# Patient Record
Sex: Male | Born: 1955 | Race: White | Hispanic: No | Marital: Single
Health system: Southern US, Community
[De-identification: ages and names within clinical notes are randomized; demographics above are authoritative.]

## PROBLEM LIST (undated history)

## (undated) DIAGNOSIS — D649 Anemia, unspecified: Secondary | ICD-10-CM

## (undated) DIAGNOSIS — G610 Guillain-Barre syndrome: Secondary | ICD-10-CM

## (undated) DIAGNOSIS — E119 Type 2 diabetes mellitus without complications: Secondary | ICD-10-CM

## (undated) DIAGNOSIS — I10 Essential (primary) hypertension: Secondary | ICD-10-CM

## (undated) DIAGNOSIS — I2510 Atherosclerotic heart disease of native coronary artery without angina pectoris: Secondary | ICD-10-CM

## (undated) DIAGNOSIS — R1310 Dysphagia, unspecified: Secondary | ICD-10-CM

## (undated) HISTORY — PX: TRACHEOSTOMY: SUR1362

---

## 2011-02-20 DEATH — deceased

## 2015-06-07 ENCOUNTER — Emergency Department (HOSPITAL_COMMUNITY): Payer: Medicare Other

## 2015-06-07 ENCOUNTER — Encounter (HOSPITAL_COMMUNITY): Payer: Self-pay | Admitting: Emergency Medicine

## 2015-06-07 ENCOUNTER — Inpatient Hospital Stay (HOSPITAL_COMMUNITY): Payer: Medicare Other

## 2015-06-07 ENCOUNTER — Inpatient Hospital Stay (HOSPITAL_COMMUNITY)
Admission: EM | Admit: 2015-06-07 | Discharge: 2015-06-10 | DRG: 004 | Disposition: A | Discharge status: Other Acute Inpatient Hospital | Payer: Medicare Other | Attending: Pulmonary Disease | Admitting: Pulmonary Disease

## 2015-06-07 DIAGNOSIS — E876 Hypokalemia: Secondary | ICD-10-CM | POA: Diagnosis present

## 2015-06-07 DIAGNOSIS — J189 Pneumonia, unspecified organism: Secondary | ICD-10-CM | POA: Diagnosis present

## 2015-06-07 DIAGNOSIS — J9621 Acute and chronic respiratory failure with hypoxia: Secondary | ICD-10-CM | POA: Diagnosis present

## 2015-06-07 DIAGNOSIS — R402312 Coma scale, best motor response, none, at arrival to emergency department: Secondary | ICD-10-CM | POA: Diagnosis present

## 2015-06-07 DIAGNOSIS — J9601 Acute respiratory failure with hypoxia: Secondary | ICD-10-CM

## 2015-06-07 DIAGNOSIS — J9819 Other pulmonary collapse: Secondary | ICD-10-CM

## 2015-06-07 DIAGNOSIS — R1310 Dysphagia, unspecified: Secondary | ICD-10-CM | POA: Diagnosis present

## 2015-06-07 DIAGNOSIS — D649 Anemia, unspecified: Secondary | ICD-10-CM | POA: Diagnosis not present

## 2015-06-07 DIAGNOSIS — I10 Essential (primary) hypertension: Secondary | ICD-10-CM | POA: Diagnosis not present

## 2015-06-07 DIAGNOSIS — Y95 Nosocomial condition: Secondary | ICD-10-CM | POA: Diagnosis present

## 2015-06-07 DIAGNOSIS — E119 Type 2 diabetes mellitus without complications: Secondary | ICD-10-CM | POA: Diagnosis present

## 2015-06-07 DIAGNOSIS — J9620 Acute and chronic respiratory failure, unspecified whether with hypoxia or hypercapnia: Secondary | ICD-10-CM | POA: Diagnosis not present

## 2015-06-07 DIAGNOSIS — R402212 Coma scale, best verbal response, none, at arrival to emergency department: Secondary | ICD-10-CM | POA: Diagnosis present

## 2015-06-07 DIAGNOSIS — R4182 Altered mental status, unspecified: Secondary | ICD-10-CM | POA: Diagnosis present

## 2015-06-07 DIAGNOSIS — J9600 Acute respiratory failure, unspecified whether with hypoxia or hypercapnia: Secondary | ICD-10-CM | POA: Diagnosis not present

## 2015-06-07 DIAGNOSIS — R000 Tachycardia, unspecified: Secondary | ICD-10-CM | POA: Diagnosis not present

## 2015-06-07 DIAGNOSIS — Z4659 Encounter for fitting and adjustment of other gastrointestinal appliance and device: Secondary | ICD-10-CM | POA: Diagnosis present

## 2015-06-07 DIAGNOSIS — Z91040 Latex allergy status: Secondary | ICD-10-CM | POA: Diagnosis not present

## 2015-06-07 DIAGNOSIS — T17990A Other foreign object in respiratory tract, part unspecified in causing asphyxiation, initial encounter: Secondary | ICD-10-CM | POA: Diagnosis present

## 2015-06-07 DIAGNOSIS — G9340 Encephalopathy, unspecified: Secondary | ICD-10-CM | POA: Diagnosis not present

## 2015-06-07 DIAGNOSIS — Z930 Tracheostomy status: Secondary | ICD-10-CM

## 2015-06-07 DIAGNOSIS — R7881 Bacteremia: Secondary | ICD-10-CM | POA: Diagnosis not present

## 2015-06-07 DIAGNOSIS — I2510 Atherosclerotic heart disease of native coronary artery without angina pectoris: Secondary | ICD-10-CM | POA: Diagnosis present

## 2015-06-07 DIAGNOSIS — R402112 Coma scale, eyes open, never, at arrival to emergency department: Secondary | ICD-10-CM | POA: Diagnosis not present

## 2015-06-07 DIAGNOSIS — G610 Guillain-Barre syndrome: Secondary | ICD-10-CM | POA: Diagnosis present

## 2015-06-07 DIAGNOSIS — J9811 Atelectasis: Secondary | ICD-10-CM | POA: Diagnosis not present

## 2015-06-07 DIAGNOSIS — E871 Hypo-osmolality and hyponatremia: Secondary | ICD-10-CM | POA: Diagnosis present

## 2015-06-07 DIAGNOSIS — Z91018 Allergy to other foods: Secondary | ICD-10-CM | POA: Diagnosis not present

## 2015-06-07 DIAGNOSIS — Z9911 Dependence on respirator [ventilator] status: Secondary | ICD-10-CM

## 2015-06-07 DIAGNOSIS — Z880 Allergy status to penicillin: Secondary | ICD-10-CM | POA: Diagnosis not present

## 2015-06-07 DIAGNOSIS — J9622 Acute and chronic respiratory failure with hypercapnia: Secondary | ICD-10-CM | POA: Diagnosis present

## 2015-06-07 DIAGNOSIS — J069 Acute upper respiratory infection, unspecified: Secondary | ICD-10-CM | POA: Insufficient documentation

## 2015-06-07 DIAGNOSIS — Z452 Encounter for adjustment and management of vascular access device: Secondary | ICD-10-CM

## 2015-06-07 DIAGNOSIS — L7682 Other postprocedural complications of skin and subcutaneous tissue: Secondary | ICD-10-CM | POA: Diagnosis not present

## 2015-06-07 DIAGNOSIS — J9690 Respiratory failure, unspecified, unspecified whether with hypoxia or hypercapnia: Secondary | ICD-10-CM

## 2015-06-07 DIAGNOSIS — R4189 Other symptoms and signs involving cognitive functions and awareness: Secondary | ICD-10-CM

## 2015-06-07 DIAGNOSIS — R401 Stupor: Secondary | ICD-10-CM | POA: Diagnosis not present

## 2015-06-07 HISTORY — DX: Essential (primary) hypertension: I10

## 2015-06-07 HISTORY — DX: Type 2 diabetes mellitus without complications: E11.9

## 2015-06-07 HISTORY — DX: Atherosclerotic heart disease of native coronary artery without angina pectoris: I25.10

## 2015-06-07 HISTORY — DX: Guillain-Barre syndrome: G61.0

## 2015-06-07 HISTORY — DX: Anemia, unspecified: D64.9

## 2015-06-07 HISTORY — DX: Dysphagia, unspecified: R13.10

## 2015-06-07 LAB — MRSA PCR SCREENING: MRSA BY PCR: NEGATIVE

## 2015-06-07 LAB — I-STAT ARTERIAL BLOOD GAS, ED
ACID-BASE EXCESS: 3 mmol/L — AB (ref 0.0–2.0)
Bicarbonate: 30.3 mEq/L — ABNORMAL HIGH (ref 20.0–24.0)
O2 SAT: 100 %
PCO2 ART: 54.4 mmHg — AB (ref 35.0–45.0)
PH ART: 7.354 (ref 7.350–7.450)
Patient temperature: 98.6
TCO2: 32 mmol/L (ref 0–100)
pO2, Arterial: 198 mmHg — ABNORMAL HIGH (ref 80.0–100.0)

## 2015-06-07 LAB — COMPREHENSIVE METABOLIC PANEL
ALT: 79 U/L — ABNORMAL HIGH (ref 17–63)
AST: 99 U/L — ABNORMAL HIGH (ref 15–41)
Albumin: 2.6 g/dL — ABNORMAL LOW (ref 3.5–5.0)
Alkaline Phosphatase: 235 U/L — ABNORMAL HIGH (ref 38–126)
Anion gap: 10 (ref 5–15)
BUN: 8 mg/dL (ref 6–20)
CHLORIDE: 91 mmol/L — AB (ref 101–111)
CO2: 29 mmol/L (ref 22–32)
Calcium: 9.6 mg/dL (ref 8.9–10.3)
Creatinine, Ser: 0.33 mg/dL — ABNORMAL LOW (ref 0.61–1.24)
Glucose, Bld: 269 mg/dL — ABNORMAL HIGH (ref 65–99)
POTASSIUM: 4.3 mmol/L (ref 3.5–5.1)
Sodium: 130 mmol/L — ABNORMAL LOW (ref 135–145)
TOTAL PROTEIN: 6.3 g/dL — AB (ref 6.5–8.1)
Total Bilirubin: 0.3 mg/dL (ref 0.3–1.2)

## 2015-06-07 LAB — TROPONIN I: Troponin I: 0.04 ng/mL — ABNORMAL HIGH (ref <0.031)

## 2015-06-07 LAB — I-STAT CG4 LACTIC ACID, ED: Lactic Acid, Venous: 2.1 mmol/L (ref 0.5–2.0)

## 2015-06-07 LAB — CBC WITH DIFFERENTIAL/PLATELET
BASOS PCT: 0 %
Basophils Absolute: 0 10*3/uL (ref 0.0–0.1)
EOS PCT: 1 %
Eosinophils Absolute: 0.3 10*3/uL (ref 0.0–0.7)
HCT: 39.9 % (ref 39.0–52.0)
Hemoglobin: 12.6 g/dL — ABNORMAL LOW (ref 13.0–17.0)
LYMPHS ABS: 3.3 10*3/uL (ref 0.7–4.0)
Lymphocytes Relative: 12 %
MCH: 27.3 pg (ref 26.0–34.0)
MCHC: 31.6 g/dL (ref 30.0–36.0)
MCV: 86.4 fL (ref 78.0–100.0)
MONO ABS: 1.6 10*3/uL — AB (ref 0.1–1.0)
MONOS PCT: 6 %
NEUTROS ABS: 22 10*3/uL — AB (ref 1.7–7.7)
Neutrophils Relative %: 81 %
PLATELETS: 438 10*3/uL — AB (ref 150–400)
RBC: 4.62 MIL/uL (ref 4.22–5.81)
RDW: 14.3 % (ref 11.5–15.5)
WBC: 27.2 10*3/uL — ABNORMAL HIGH (ref 4.0–10.5)

## 2015-06-07 LAB — GLUCOSE, CAPILLARY
Glucose-Capillary: 184 mg/dL — ABNORMAL HIGH (ref 65–99)
Glucose-Capillary: 165 mg/dL — ABNORMAL HIGH (ref 65–99)

## 2015-06-07 MED ORDER — ETOMIDATE 2 MG/ML IV SOLN
INTRAVENOUS | Status: DC | PRN
  Administered 2015-06-07: 20 mg via INTRAVENOUS

## 2015-06-07 MED ORDER — SODIUM CHLORIDE 0.9 % IV SOLN
INTRAVENOUS | Status: DC
  Administered 2015-06-07 – 2015-06-09 (×4): via INTRAVENOUS

## 2015-06-07 MED ORDER — PANTOPRAZOLE SODIUM 40 MG IV SOLR
40.0000 mg | Freq: Every day | INTRAVENOUS | Status: DC
  Administered 2015-06-07 – 2015-06-08 (×2): 40 mg via INTRAVENOUS
  Filled 2015-06-07 (×3): qty 40

## 2015-06-07 MED ORDER — VANCOMYCIN HCL IN DEXTROSE 1-5 GM/200ML-% IV SOLN
1000.0000 mg | Freq: Once | INTRAVENOUS | Status: AC
  Administered 2015-06-07: 1000 mg via INTRAVENOUS
  Filled 2015-06-07: qty 200

## 2015-06-07 MED ORDER — SENNOSIDES 8.8 MG/5ML PO SYRP: 5.0000 mL | ORAL_SOLUTION | Freq: Two times a day (BID) | ORAL | Status: DC | PRN

## 2015-06-07 MED ORDER — LEVOFLOXACIN IN D5W 750 MG/150ML IV SOLN
750.0000 mg | INTRAVENOUS | Status: DC
  Administered 2015-06-07 – 2015-06-09 (×3): 750 mg via INTRAVENOUS
  Filled 2015-06-07 (×4): qty 150

## 2015-06-07 MED ORDER — AZTREONAM 2 G IJ SOLR
2.0000 g | INTRAMUSCULAR | Status: AC
  Administered 2015-06-07: 2 g via INTRAVENOUS
  Filled 2015-06-07 (×2): qty 2

## 2015-06-07 MED ORDER — DEXTROSE 5 % IV SOLN
2.0000 g | Freq: Three times a day (TID) | INTRAVENOUS | Status: DC
  Administered 2015-06-07 – 2015-06-09 (×4): 2 g via INTRAVENOUS
  Filled 2015-06-07 (×6): qty 2

## 2015-06-07 MED ORDER — ANTISEPTIC ORAL RINSE SOLUTION (CORINZ)
7.0000 mL | Freq: Four times a day (QID) | OROMUCOSAL | Status: DC
  Administered 2015-06-07 – 2015-06-10 (×11): 7 mL via OROMUCOSAL

## 2015-06-07 MED ORDER — DEXTROSE 5 % IV SOLN
1.0000 g | Freq: Three times a day (TID) | INTRAVENOUS | Status: DC
  Filled 2015-06-07 (×2): qty 1

## 2015-06-07 MED ORDER — VITAL HIGH PROTEIN PO LIQD
1000.0000 mL | ORAL | Status: DC
  Administered 2015-06-07: 1000 mL
  Filled 2015-06-07 (×2): qty 1000

## 2015-06-07 MED ORDER — HEPARIN SODIUM (PORCINE) 5000 UNIT/ML IJ SOLN
5000.0000 [IU] | Freq: Three times a day (TID) | INTRAMUSCULAR | Status: DC
  Administered 2015-06-07 – 2015-06-10 (×8): 5000 [IU] via SUBCUTANEOUS
  Filled 2015-06-07 (×9): qty 1

## 2015-06-07 MED ORDER — INSULIN GLARGINE 100 UNIT/ML ~~LOC~~ SOLN
5.0000 [IU] | Freq: Every day | SUBCUTANEOUS | Status: DC
  Administered 2015-06-07 – 2015-06-09 (×3): 5 [IU] via SUBCUTANEOUS
  Filled 2015-06-07 (×4): qty 0.05

## 2015-06-07 MED ORDER — FENTANYL CITRATE (PF) 100 MCG/2ML IJ SOLN
25.0000 ug | INTRAMUSCULAR | Status: DC | PRN
Start: 2015-06-07 — End: 2015-06-10
  Administered 2015-06-07: 50 ug via INTRAVENOUS
  Administered 2015-06-07: 25 ug via INTRAVENOUS
  Filled 2015-06-07 (×2): qty 2

## 2015-06-07 MED ORDER — FENTANYL CITRATE (PF) 100 MCG/2ML IJ SOLN
100.0000 ug | INTRAMUSCULAR | Status: DC | PRN
Start: 2015-06-07 — End: 2015-06-10
  Administered 2015-06-08 – 2015-06-10 (×10): 100 ug via INTRAVENOUS
  Filled 2015-06-07 (×10): qty 2

## 2015-06-07 MED ORDER — INSULIN ASPART 100 UNIT/ML ~~LOC~~ SOLN
0.0000 [IU] | SUBCUTANEOUS | Status: DC
  Administered 2015-06-07 (×2): 3 [IU] via SUBCUTANEOUS
  Administered 2015-06-08: 5 [IU] via SUBCUTANEOUS
  Administered 2015-06-08: 3 [IU] via SUBCUTANEOUS
  Administered 2015-06-08: 2 [IU] via SUBCUTANEOUS
  Administered 2015-06-08 (×2): 5 [IU] via SUBCUTANEOUS
  Administered 2015-06-08: 3 [IU] via SUBCUTANEOUS
  Administered 2015-06-09: 5 [IU] via SUBCUTANEOUS
  Administered 2015-06-09: 3 [IU] via SUBCUTANEOUS
  Administered 2015-06-09: 2 [IU] via SUBCUTANEOUS
  Administered 2015-06-09 – 2015-06-10 (×6): 3 [IU] via SUBCUTANEOUS

## 2015-06-07 MED ORDER — SODIUM CHLORIDE 0.9 % IV BOLUS (SEPSIS)
500.0000 mL | Freq: Once | INTRAVENOUS | Status: AC
  Administered 2015-06-07: 500 mL via INTRAVENOUS

## 2015-06-07 MED ORDER — ALBUTEROL SULFATE (2.5 MG/3ML) 0.083% IN NEBU
2.5000 mg | INHALATION_SOLUTION | RESPIRATORY_TRACT | Status: DC | PRN
  Administered 2015-06-10: 2.5 mg via RESPIRATORY_TRACT
  Filled 2015-06-07: qty 3

## 2015-06-07 MED ORDER — DEXTROSE 5 % IV SOLN
1.0000 g | Freq: Once | INTRAVENOUS | Status: DC
  Filled 2015-06-07: qty 1

## 2015-06-07 MED ORDER — SODIUM CHLORIDE 0.9 % IV SOLN: 250.0000 mL | INTRAVENOUS | Status: DC | PRN

## 2015-06-07 MED ORDER — BISACODYL 10 MG RE SUPP
10.0000 mg | Freq: Every day | RECTAL | Status: DC | PRN
Start: 2015-06-07 — End: 2015-06-10

## 2015-06-07 MED ORDER — VANCOMYCIN HCL 10 G IV SOLR
1250.0000 mg | Freq: Two times a day (BID) | INTRAVENOUS | Status: DC
  Administered 2015-06-08 – 2015-06-09 (×3): 1250 mg via INTRAVENOUS
  Filled 2015-06-07 (×5): qty 1250

## 2015-06-07 MED ORDER — MIDAZOLAM HCL 2 MG/2ML IJ SOLN
2.0000 mg | INTRAMUSCULAR | Status: DC | PRN
  Administered 2015-06-07 – 2015-06-09 (×6): 2 mg via INTRAVENOUS
  Filled 2015-06-07 (×6): qty 2

## 2015-06-07 MED ORDER — CHLORHEXIDINE GLUCONATE 0.12% ORAL RINSE (MEDLINE KIT)
15.0000 mL | Freq: Two times a day (BID) | OROMUCOSAL | Status: DC
  Administered 2015-06-07 – 2015-06-10 (×6): 15 mL via OROMUCOSAL

## 2015-06-07 NOTE — ED Notes (Signed)
Patient now alert follow commands. Patient able to feel RN touching both sides of face, arms and feet. Patient able to stick out tongue. Patient nod yes and no to answer questions .

## 2015-06-07 NOTE — Progress Notes (Signed)
Patient intubated by ED physician good color change on ETCO2 detector, BBS equal,  X-Ray confirmed placement,  placed on above vent settings MD aware.

## 2015-06-07 NOTE — Code Documentation (Addendum)
Patient comes from [facility] hospital states normally talkative. Per EMS Patient had passed swallow screen with Honey and began eating lunch and became unresponsive. Patient Full Code per ems. Patient unable to follow commands on arrival. Patient given OPA  Vitals" HR 108 CBG 250. Patient has single lumen PICC Right arm placed PTA

## 2015-06-07 NOTE — Code Documentation (Signed)
Respirtory currently bagging.

## 2015-06-07 NOTE — Progress Notes (Addendum)
ANTIBIOTIC CONSULT NOTE - INITIAL  Pharmacy Consult for vancomycin + cefepime Indication: rule out pneumonia  Allergies  Allergen Reactions  . Dairy Aid [Lactase]   . Eggs Or Egg-Derived Products   . Gluten Meal   . Latex   . Penicillins     Patient Measurements: Height: 6\' 2"  (188 cm) IBW/kg (Calculated) : 82.2 Adjusted Body Weight:   Vital Signs: Temp: 99.3 F (37.4 C) (12/17 1407) Temp Source: Oral (12/17 1407) BP: 106/72 mmHg (12/17 1500) Pulse Rate: 102 (12/17 1500) Intake/Output from previous day:   Intake/Output from this shift:    Labs:  Recent Labs  06/07/15 1302  WBC 27.2*  HGB 12.6*  PLT 438*  CREATININE 0.33*   CrCl cannot be calculated (Unknown ideal weight.). No results for input(s): VANCOTROUGH, VANCOPEAK, VANCORANDOM, GENTTROUGH, GENTPEAK, GENTRANDOM, TOBRATROUGH, TOBRAPEAK, TOBRARND, AMIKACINPEAK, AMIKACINTROU, AMIKACIN in the last 72 hours.   Microbiology: No results found for this or any previous visit (from the past 720 hour(s)).  Medical History: Past Medical History  Diagnosis Date  . Guillain Barr syndrome (HCC)   . Anemia   . Hypertension   . Coronary artery disease   . Diabetes mellitus without complication (HCC)   . Dysphagia     Medications:  Anti-infectives    Start     Dose/Rate Route Frequency Ordered Stop   06/08/15 0400  vancomycin (VANCOCIN) 1,250 mg in sodium chloride 0.9 % 250 mL IVPB     1,250 mg 166.7 mL/hr over 90 Minutes Intravenous Every 12 hours 06/07/15 1514     06/08/15 0000  ceFEPIme (MAXIPIME) 1 g in dextrose 5 % 50 mL IVPB     1 g 100 mL/hr over 30 Minutes Intravenous Every 8 hours 06/07/15 1514     06/07/15 1530  vancomycin (VANCOCIN) IVPB 1000 mg/200 mL premix     1,000 mg 200 mL/hr over 60 Minutes Intravenous  Once 06/07/15 1510     06/07/15 1430  ceFEPIme (MAXIPIME) 1 g in dextrose 5 % 50 mL IVPB     1 g 100 mL/hr over 30 Minutes Intravenous  Once 06/07/15 1414     06/07/15 1400  vancomycin  (VANCOCIN) IVPB 1000 mg/200 mL premix     1,000 mg 200 mL/hr over 60 Minutes Intravenous  Once 06/07/15 1355       Assessment: 59 yom presented to the hospital unresponsive from [facility]. To start empiric vancomycin + cefepime for possible pneumonia. Pt with Tmax of 99.3 and WBC is elevated at 27.2. Scr is 0.33. Pt has a history of GBS so renal function is difficult to estimate. Lactic acid is elevated at 2.1.   Vanc 12/17>> Cefepime 12/17>>  Goal of Therapy:  Vancomycin trough level 15-20 mcg/ml  Plan:  - Vancomycin 2gm IV x 1 (1gm + 1gm doses) then 1250mg  IV Q12H - Cefepime 1gm IV Q8H - F/u renal fxn, C&S, [facility]  Rumbarger, Drake Leachachel Lynn 06/07/2015,3:15 PM  Addendum:  ED Rn called and stated that [facility] that pt may have hives/anaphylaxis to cephalexin. D/w Dr. Patria Maneampos, we'll change cefepime to aztreonam for GNR coverage.   Ulyses SouthwardMinh Pham, PharmD Pager: [phone] 06/07/2015 3:59 PM

## 2015-06-07 NOTE — H&P (Signed)
PULMONARY / CRITICAL CARE MEDICINE   Name: Curtis Burgess MRN: [id number] DOB: 12/22/1955    ADMISSION DATE:  06/07/2015  REFERRING MD:  Curtis Burgess([name])   CHIEF COMPLAINT:  Respiratory failure, AMS   HISTORY OF PRESENT ILLNESS:   59yo male with hx HTN, CAD, DM recent prolonged illness r/t guillain Barre with prolonged vent course and trach placement at [facility].  [facility], ultimately liberated from vent and was recently decannulated 2 days prior to admit.  On 12/17 he passed swallow eval and was eating lunch when he suddenly became unresponsive. EMS was called and pt remained unresponsive with GSC 3, intubated in ER.  He was taken to head CT and on returning from CT became suddenly responsive, appropriate, MAE.  Has ongoing generalized weakness r/t GB.  PCCM called to admit.   Pt c/o back and L leg pain.  Denies any memory of earlier events.  Denies SOB, chest pain, cough, hemoptysis, fevers, headache, lightheadedness, dysphagia today.    PAST MEDICAL HISTORY :  He  has a past medical history of Guillain Barr syndrome (HCC); Anemia; Hypertension; Coronary artery disease; Diabetes mellitus without complication (HCC); and Dysphagia.  PAST SURGICAL HISTORY: He  has no past surgical history on file.  Allergies  Allergen Reactions  . Dairy Aid [Lactase]   . Eggs Or Egg-Derived Products   . Gluten Meal   . Latex   . Penicillins     No current facility-administered medications on file prior to encounter.   No current outpatient prescriptions on file prior to encounter.    FAMILY HISTORY:  No known family hz seizures   SOCIAL HISTORY: He     REVIEW OF SYSTEMS:   As per HPI - All other systems reviewed and were neg.    SUBJECTIVE:    VITAL SIGNS: BP 104/70 mmHg  Pulse 101  Temp(Src) 99.3 F (37.4 C) (Oral)  Resp 27  Ht 6\' 2"  (1.88 m)  SpO2 100%  HEMODYNAMICS:    VENTILATOR SETTINGS: Vent Mode:  [-] PRVC FiO2 (%):  [100 %] 100 % Set Rate:  [18 bmp] 18  bmp Vt Set:  [650 mL] 650 mL PEEP:  [5 cmH20] 5 cmH20 Plateau Pressure:  [22 cmH20] 22 cmH20  INTAKE / OUTPUT:    PHYSICAL EXAMINATION: General:  Chronically ill appearing male, NAD on vent  Neuro:  Now awake, alert, nods appropriately, MAE, significant gen weakness  HEENT:  Mm moist, ETT, trach stoma essentially healed  Cardiovascular:  s1s2 rrr Lungs:  resps even non labored on vent, few scattered rhonchi  Abdomen:  Round, soft, non tender  Musculoskeletal:  Warm and dry, scant BLE edema  LABS:  BMET  Recent Labs Lab 06/07/15 1302  NA 130*  K 4.3  CL 91*  CO2 29  BUN 8  CREATININE 0.33*  GLUCOSE 269*    Electrolytes  Recent Labs Lab 06/07/15 1302  CALCIUM 9.6    CBC  Recent Labs Lab 06/07/15 1302  WBC 27.2*  HGB 12.6*  HCT 39.9  PLT 438*    Coag's No results for input(s): APTT, INR in the last 168 hours.  Sepsis Markers  Recent Labs Lab 06/07/15 1330  LATICACIDVEN 2.10*    ABG  Recent Labs Lab 06/07/15 1400  PHART 7.354  PCO2ART 54.4*  PO2ART 198.0*    Liver Enzymes  Recent Labs Lab 06/07/15 1302  AST 99*  ALT 79*  ALKPHOS 235*  BILITOT 0.3  ALBUMIN 2.6*    Cardiac  Enzymes  Recent Labs Lab 06/07/15 1302  TROPONINI 0.04*    Glucose No results for input(s): GLUCAP in the last 168 hours.  Imaging Ct Head Wo Contrast  06/07/2015  CLINICAL DATA:  Unresponsive patient. EXAM: CT HEAD WITHOUT CONTRAST TECHNIQUE: Contiguous axial images were obtained from the base of the skull through the vertex without intravenous contrast. COMPARISON:  None. FINDINGS: Ventricles and sulci are prominent compatible with atrophy. Periventricular and subcortical white matter hypodensity compatible with chronic small vessel ischemic changes. No evidence for acute cortically based infarct, intracranial hemorrhage, mass lesion or mass-effect. Orbits are unremarkable. Paranasal sinuses are well aerated. Mastoid air cells are unremarkable. Calvarium  is intact. IMPRESSION: No acute intracranial process. Chronic small vessel ischemic changes. Electronically Signed   By: Annia Belt M.D.   On: 06/07/2015 14:03   Dg Chest Portable 1 View  06/07/2015  CLINICAL DATA:  Patient is unresponsive. Evaluate endotracheal tube placement. EXAM: PORTABLE CHEST 1 VIEW COMPARISON:  None. FINDINGS: ET tube terminates in the distal trachea, 1 cm superior to the carina. Right upper extremity PICC line tip projects over the superior vena cava. Stable cardiac and mediastinal contours. Left mid and lower lung heterogeneous pulmonary opacities. Heterogeneous opacities right lung base. Probable small left pleural effusion. IMPRESSION: Left mid and lower lung and right lung base heterogeneous opacities concerning for multi focal infection or aspiration in the appropriate clinical setting. ET tube terminates in the distal trachea.  Recommend retraction. Continued radiographic follow-up is recommended to ensure resolution of pulmonary findings. Electronically Signed   By: Annia Belt M.D.   On: 06/07/2015 13:59     STUDIES:  CT head 12/17>>> neg acute   CULTURES: Sputum 12/17>>>  ANTIBIOTICS: vanc 12/17>>>  Cefepime 12/17>>>  SIGNIFICANT EVENTS:   LINES/TUBES: ETT 12/17>>> RUE PICC (pta) >>>  DISCUSSION: 59yo male with previous trach in setting guillain barre, recently decannulated presenting with AMS of unclear etiology.  Suspect primary respiratory failure with hypercarbia, which resolved with mechanical ventilation.    ASSESSMENT / PLAN:  PULMONARY Acute on chronic respiratory failure  ?HCAP  P:   Vent support - 8cc/kg - did not tol PS r/t poor Vt F/u CXR  F/u ABG  Ongoing neuromuscular weakness - may need redo trach for now.   abx as below    CARDIOVASCULAR Hx HTN  Hx CAD  P:  Hold antiHTN for now  ASA   RENAL Hyponatremia  P:   F/u chem   GASTROINTESTINAL Dysphagia  P:   TF  Will need repeat swallow eval if extubated  PEG may  still be necessary   HEMATOLOGIC Leukocytosis  P:  F/u cbc  SQ heparin   INFECTIOUS ?HCAP  P:   Vanc, cefepime as above  Sputum culture   ENDOCRINE DM    P:   SSI, low dose lantus  Await pharmacy med rec   NEUROLOGIC AMS - acute.  Now resolved.  Unclear etiology.  Suspect acute respiratory failure r/t HCAP +/- residual neuromuscular weakness from guillain barre.  Doubt seizure, no known seizure hx.  Head CT neg.   Hx guillain barre  P:   RASS goal: 0 PRN fentanyl while on vent     FAMILY  - Updates:  Pt updated, no family available 12/17    Dirk Dress, NP 06/07/2015  2:59 PM Pager: [phone] or [phone]

## 2015-06-07 NOTE — ED Provider Notes (Signed)
CSN: [id number]     Arrival date & time 06/07/15  1255 History   First MD Initiated Contact with Patient 06/07/15 1300     Chief Complaint  Patient presents with  . unresponsive      Level V caveat: Unresponsive  HPI Patient is brought to the emergency department from [facility] after an episode of unresponsiveness while eating lunch.  She is currently rehabilitating from a prolonged course of Katheran AweGilliam Barr and ventilatory dependent respiratory failure for which she required tracheostomy.  He never had a PEG tube placed.  He reported he was decannulated 2 days ago and had been in his normal state of health for him this morning.  His reported that he had a swallowing study performed for which he passed and was given lunch at which point he developed unresponsiveness.  EMS reports the patient's been unresponsive in route and required nonrebreather for hypoxia.  Patient is brought to the emergency department as his GCS 3.  He is not moving anything.  He will not respond to painful stimuli.  He will not localize or withdrawal.. Intubated on arrival for airway protection.    Past Medical History  Diagnosis Date  . Guillain Barr syndrome (HCC)   . Anemia   . Hypertension   . Coronary artery disease   . Diabetes mellitus without complication (HCC)   . Dysphagia    History reviewed. No pertinent past surgical history. No family history on file. Social History  Substance Use Topics  . Smoking status: Unknown If Ever Smoked  . Smokeless tobacco: None  . Alcohol Use: None    Review of Systems  Unable to perform ROS: Mental status change      Allergies  Dairy aid; Eggs or egg-derived products; Gluten meal; Latex; and Penicillins  Home Medications   Prior to Admission medications   Not on File   BP 120/74 mmHg  Pulse 103  Temp(Src) 99.3 F (37.4 C) (Oral)  Resp 27  Ht 6\' 2"  (1.88 m)  SpO2 100% Physical Exam  Constitutional: He appears well-developed.   Chronically ill-appearing  HENT:  Head: Normocephalic and atraumatic.  Eyes: EOM are normal.  Neck: Neck supple.  Tracheostomy stoma is closing at this time  Cardiovascular: Normal rate, regular rhythm, normal heart sounds and intact distal pulses.   Pulmonary/Chest: Effort normal and breath sounds normal. No respiratory distress.  Abdominal: Soft. He exhibits no distension. There is no tenderness.  Musculoskeletal: He exhibits edema.  Neurological:  GCS 3  Skin: Skin is warm and dry. No rash noted.  Psychiatric: He has a normal mood and affect.  Nursing note and vitals reviewed.   ED Course  Procedures (including critical care time)  INTUBATION Performed by: Lyanne CoAMPOS,Cidney Kirkwood M Required items: required blood products, implants, devices, and special equipment available Patient identity confirmed: provided demographic data and hospital-assigned identification number Time out: Immediately prior to procedure a "time out" was called to verify the correct patient, procedure, equipment, support staff and site/side marked as required. Indications: uresponsive/airway protection Intubation method: Glidescope Laryngoscopy  Preoxygenation: BVM Sedatives: Etomidate Paralytic: NONE Tube Size: 7.5 cuffed Post-procedure assessment: chest rise and ETCO2 monitor Breath sounds: equal and absent over the epigastrium Tube secured with: ETT holder Chest x-ray interpreted by radiologist and me. Chest x-ray findings: endotracheal tube slightly deep, will be pulled back Patient tolerated the procedure well with no immediate complications.   CRITICAL CARE Performed by: Lyanne CoAMPOS,Xsavier Seeley M Total critical care time: 35 minutes Critical care time was  exclusive of separately billable procedures and treating other patients. Critical care was necessary to treat or prevent imminent or life-threatening deterioration. Critical care was time spent personally by me on the following activities: development of  treatment plan with patient and/or surrogate as well as nursing, discussions with consultants, evaluation of patient's response to treatment, examination of patient, obtaining history from patient or surrogate, ordering and performing treatments and interventions, ordering and review of laboratory studies, ordering and review of radiographic studies, pulse oximetry and re-evaluation of patient's condition.   Labs Review Labs Reviewed  CBC WITH DIFFERENTIAL/PLATELET - Abnormal; Notable for the following:    WBC 27.2 (*)    Hemoglobin 12.6 (*)    Platelets 438 (*)    All other components within normal limits  I-STAT CG4 LACTIC ACID, ED - Abnormal; Notable for the following:    Lactic Acid, Venous 2.10 (*)    All other components within normal limits  I-STAT ARTERIAL BLOOD GAS, ED - Abnormal; Notable for the following:    pCO2 arterial 54.4 (*)    pO2, Arterial 198.0 (*)    Bicarbonate 30.3 (*)    Acid-Base Excess 3.0 (*)    All other components within normal limits  CULTURE, BLOOD (ROUTINE X 2)  CULTURE, BLOOD (ROUTINE X 2)  COMPREHENSIVE METABOLIC PANEL  TROPONIN I    Imaging Review Ct Head Wo Contrast  06/07/2015  CLINICAL DATA:  Unresponsive patient. EXAM: CT HEAD WITHOUT CONTRAST TECHNIQUE: Contiguous axial images were obtained from the base of the skull through the vertex without intravenous contrast. COMPARISON:  None. FINDINGS: Ventricles and sulci are prominent compatible with atrophy. Periventricular and subcortical white matter hypodensity compatible with chronic small vessel ischemic changes. No evidence for acute cortically based infarct, intracranial hemorrhage, mass lesion or mass-effect. Orbits are unremarkable. Paranasal sinuses are well aerated. Mastoid air cells are unremarkable. Calvarium is intact. IMPRESSION: No acute intracranial process. Chronic small vessel ischemic changes. Electronically Signed   By: Annia Belt M.D.   On: 06/07/2015 14:03   Dg Chest Portable 1  View  06/07/2015  CLINICAL DATA:  Patient is unresponsive. Evaluate endotracheal tube placement. EXAM: PORTABLE CHEST 1 VIEW COMPARISON:  None. FINDINGS: ET tube terminates in the distal trachea, 1 cm superior to the carina. Right upper extremity PICC line tip projects over the superior vena cava. Stable cardiac and mediastinal contours. Left mid and lower lung heterogeneous pulmonary opacities. Heterogeneous opacities right lung base. Probable small left pleural effusion. IMPRESSION: Left mid and lower lung and right lung base heterogeneous opacities concerning for multi focal infection or aspiration in the appropriate clinical setting. ET tube terminates in the distal trachea.  Recommend retraction. Continued radiographic follow-up is recommended to ensure resolution of pulmonary findings. Electronically Signed   By: Annia Belt M.D.   On: 06/07/2015 13:59   I have personally reviewed and evaluated these images and lab results as part of my medical decision-making.   EKG Interpretation   Date/Time:  Saturday June 07 2015 12:59:13 EST Ventricular Rate:  108 PR Interval:  153 QRS Duration: 103 QT Interval:  339 QTC Calculation: 454 R Axis:   -15 Text Interpretation:  Sinus tachycardia Right atrial enlargement  Borderline left axis deviation RSR' in V1 or V2, probably normal variant  Borderline ST depression, anterolateral leads Baseline wander in lead(s)  V4 V5 V6 No old tracing to compare Confirmed by Calianne Larue  MD, Caryn Bee ([id number])  on 06/07/2015 1:39:35 PM      MDM   Final  diagnoses:  Acute respiratory failure, unspecified whether with hypoxia or hypercapnia (HCC)  Unresponsive    Acute altered mental status.  While in the emergency department after head CT and chest x-ray his mental status has significantly improved.  This may represent hypercarbic respiratory failure.  Seizure with postictal status possibility as well.  Patient is currently following commands.  He is not pulling  tidal volumes that are significant enough to extubate at this time.  Critical care treatment.  Chest x-ray concerning for aspiration versus developing pneumonia.  Facial be covered for healthcare associated pneumonia.    Azalia Bilis, MD 06/07/15 [phone]

## 2015-06-07 NOTE — ED Notes (Signed)
Lactic Acid results shown to Dr.[name] ED-Lab

## 2015-06-07 NOTE — ED Notes (Signed)
Mucus noted in airway Per MD.

## 2015-06-07 NOTE — ED Notes (Signed)
Prepare for intabutation

## 2015-06-07 NOTE — Progress Notes (Signed)
eLink Physician-Brief Progress Note Patient Name: Curtis Burgess DOB: 11/12/1955 MRN: [id number]   Date of Service  06/07/2015  HPI/Events of Note  Sinus Tachycardia - HR = 132. Not febrile at present.   eICU Interventions  Will order: 1. Bolus with 0.9 NaCl 500 mL IV over 30 minutes now.      Intervention Category Intermediate Interventions: Arrhythmia - evaluation and management  Kaida Games Eugene 06/07/2015, 7:03 PM

## 2015-06-07 NOTE — Progress Notes (Signed)
ANTIBIOTIC CONSULT NOTE - INITIAL  Pharmacy Consult for cefepime Indication: pneumonia  Allergies  Allergen Reactions  . Dairy Aid [Lactase]   . Eggs Or Egg-Derived Products   . Gluten Meal   . Latex   . Penicillins     Patient Measurements: Height: 6\' 2"  (188 cm) IBW/kg (Calculated) : 82.2   Vital Signs: BP: 120/74 mmHg (12/17 1345) Pulse Rate: 103 (12/17 1345) Intake/Output from previous day:   Intake/Output from this shift:    Labs:  Recent Labs  06/07/15 1302  WBC 27.2*  HGB 12.6*  PLT 438*   CrCl cannot be calculated (Unknown ideal weight.). No results for input(s): VANCOTROUGH, VANCOPEAK, VANCORANDOM, GENTTROUGH, GENTPEAK, GENTRANDOM, TOBRATROUGH, TOBRAPEAK, TOBRARND, AMIKACINPEAK, AMIKACINTROU, AMIKACIN in the last 72 hours.   Microbiology: No results found for this or any previous visit (from the past 720 hour(s)).  Medical History: Past Medical History  Diagnosis Date  . Guillain Barr syndrome (HCC)   . Anemia   . Hypertension   . Coronary artery disease   . Diabetes mellitus without complication (HCC)   . Dysphagia     Assessment: [age] YOM from [facility] who was eating lunch and became unresponsive. Required intubation in ED.  WBC elevated at 27.2, no temperature measured yet. Patient tachypnic and tachycardic in the ED. Labs are in process here.  Noted in Care Everywhere that was diagnosed with [facility] this year. Last SCr on file from there is 0.2- likely loss of muscle mass and not a true representation of renal function.  Vancomycin 1g IV x1 ordered by EDP. No weight on file for patient.  12/17 BCx: sent  Goal of Therapy:  Vancomycin trough level 15-20 mcg/ml  Plan:  -cefepime 1g IV x1 for now -follow up lab work, documentation of weight to determine maintenance doses -follow for orders to continue vancomycin  Hussein Macdougal D. Piotr Christopher, PharmD, BCPS Clinical Pharmacist Pager: [phone] 06/07/2015 2:10  PM

## 2015-06-07 NOTE — ED Notes (Signed)
Attempted report 

## 2015-06-07 NOTE — Code Documentation (Signed)
7 1/2 ET tube  26 at the lip. Color changed noted.

## 2015-06-07 NOTE — ED Notes (Signed)
Attempted to contact Son. Son did not answer patient request to try within a hour.

## 2015-06-07 NOTE — Code Documentation (Signed)
Patient unable to follow MD commands.

## 2015-06-08 ENCOUNTER — Inpatient Hospital Stay (HOSPITAL_COMMUNITY): Payer: Medicare Other

## 2015-06-08 DIAGNOSIS — J9620 Acute and chronic respiratory failure, unspecified whether with hypoxia or hypercapnia: Secondary | ICD-10-CM

## 2015-06-08 LAB — POCT I-STAT 3, ART BLOOD GAS (G3+)
Acid-Base Excess: 1 mmol/L (ref 0.0–2.0)
Bicarbonate: 24.9 mEq/L — ABNORMAL HIGH (ref 20.0–24.0)
O2 SAT: 97 %
TCO2: 26 mmol/L (ref 0–100)
pCO2 arterial: 34.7 mmHg — ABNORMAL LOW (ref 35.0–45.0)
pH, Arterial: 7.463 — ABNORMAL HIGH (ref 7.350–7.450)
pO2, Arterial: 89 mmHg (ref 80.0–100.0)

## 2015-06-08 LAB — GLUCOSE, CAPILLARY
GLUCOSE-CAPILLARY: 204 mg/dL — AB (ref 65–99)
GLUCOSE-CAPILLARY: 179 mg/dL — AB (ref 65–99)
Glucose-Capillary: 136 mg/dL — ABNORMAL HIGH (ref 65–99)
Glucose-Capillary: 170 mg/dL — ABNORMAL HIGH (ref 65–99)
Glucose-Capillary: 220 mg/dL — ABNORMAL HIGH (ref 65–99)
Glucose-Capillary: 222 mg/dL — ABNORMAL HIGH (ref 65–99)

## 2015-06-08 LAB — BASIC METABOLIC PANEL
ANION GAP: 8 (ref 5–15)
BUN: 9 mg/dL (ref 6–20)
CALCIUM: 9.9 mg/dL (ref 8.9–10.3)
CO2: 28 mmol/L (ref 22–32)
CREATININE: 0.31 mg/dL — AB (ref 0.61–1.24)
Chloride: 96 mmol/L — ABNORMAL LOW (ref 101–111)
GFR calc Af Amer: >60 mL/min (ref >60)
GLUCOSE: 173 mg/dL — AB (ref 65–99)
Potassium: 3.3 mmol/L — ABNORMAL LOW (ref 3.5–5.1)
Sodium: 132 mmol/L — ABNORMAL LOW (ref 135–145)

## 2015-06-08 LAB — CBC
HCT: 36.4 % — ABNORMAL LOW (ref 39.0–52.0)
Hemoglobin: 11.9 g/dL — ABNORMAL LOW (ref 13.0–17.0)
MCH: 27.7 pg (ref 26.0–34.0)
MCHC: 32.7 g/dL (ref 30.0–36.0)
MCV: 84.7 fL (ref 78.0–100.0)
PLATELETS: 312 10*3/uL (ref 150–400)
RBC: 4.3 MIL/uL (ref 4.22–5.81)
RDW: 14.3 % (ref 11.5–15.5)
WBC: 15.4 10*3/uL — AB (ref 4.0–10.5)

## 2015-06-08 LAB — MAGNESIUM
Magnesium: 1.5 mg/dL — ABNORMAL LOW (ref 1.7–2.4)
Magnesium: 1.9 mg/dL (ref 1.7–2.4)
Magnesium: 1.8 mg/dL (ref 1.7–2.4)

## 2015-06-08 LAB — PHOSPHORUS
PHOSPHORUS: 1.8 mg/dL — AB (ref 2.5–4.6)
Phosphorus: 2.3 mg/dL — ABNORMAL LOW (ref 2.5–4.6)
Phosphorus: 2.2 mg/dL — ABNORMAL LOW (ref 2.5–4.6)

## 2015-06-08 MED ORDER — CARBAMAZEPINE 200 MG PO TABS
200.0000 mg | ORAL_TABLET | Freq: Three times a day (TID) | ORAL | Status: DC
  Administered 2015-06-08 – 2015-06-09 (×6): 200 mg via ORAL
  Filled 2015-06-08 (×9): qty 1

## 2015-06-08 MED ORDER — PRIMIDONE 50 MG PO TABS
50.0000 mg | ORAL_TABLET | Freq: Three times a day (TID) | ORAL | Status: DC
Start: 2015-06-08 — End: 2015-06-10
  Administered 2015-06-08 – 2015-06-09 (×6): 50 mg via ORAL
  Filled 2015-06-08 (×9): qty 1

## 2015-06-08 MED ORDER — VITAL HIGH PROTEIN PO LIQD
1000.0000 mL | ORAL | Status: DC
  Administered 2015-06-08: 1000 mL
  Filled 2015-06-08 (×3): qty 1000

## 2015-06-08 MED ORDER — AMLODIPINE BESYLATE 5 MG PO TABS
5.0000 mg | ORAL_TABLET | Freq: Every day | ORAL | Status: DC
  Administered 2015-06-08 – 2015-06-09 (×2): 5 mg via ORAL
  Filled 2015-06-08 (×3): qty 1

## 2015-06-08 MED ORDER — ESCITALOPRAM OXALATE 20 MG PO TABS
20.0000 mg | ORAL_TABLET | Freq: Every day | ORAL | Status: DC
  Administered 2015-06-08 – 2015-06-09 (×2): 20 mg via ORAL
  Filled 2015-06-08 (×3): qty 1

## 2015-06-08 MED ORDER — POLYETHYLENE GLYCOL 3350 17 G PO PACK: 17.0000 g | PACK | Freq: Two times a day (BID) | ORAL | Status: DC | PRN

## 2015-06-08 MED ORDER — PRO-STAT SUGAR FREE PO LIQD
30.0000 mL | Freq: Two times a day (BID) | ORAL | Status: DC
  Administered 2015-06-08 – 2015-06-09 (×3): 30 mL
  Filled 2015-06-08 (×4): qty 30

## 2015-06-08 MED ORDER — METOPROLOL TARTRATE 12.5 MG HALF TABLET
12.5000 mg | ORAL_TABLET | Freq: Two times a day (BID) | ORAL | Status: DC
  Administered 2015-06-08 – 2015-06-09 (×4): 12.5 mg via ORAL
  Filled 2015-06-08 (×6): qty 1

## 2015-06-08 MED ORDER — SODIUM CHLORIDE 0.9 % IV SOLN
6.0000 g | Freq: Once | INTRAVENOUS | Status: AC
  Administered 2015-06-08: 6 g via INTRAVENOUS
  Filled 2015-06-08: qty 12

## 2015-06-08 MED ORDER — LAMOTRIGINE 150 MG PO TABS
150.0000 mg | ORAL_TABLET | Freq: Every day | ORAL | Status: DC
  Administered 2015-06-08 – 2015-06-09 (×2): 150 mg via ORAL
  Filled 2015-06-08 (×3): qty 1

## 2015-06-08 MED ORDER — POTASSIUM CHLORIDE 20 MEQ/15ML (10%) PO SOLN
20.0000 meq | ORAL | Status: AC
  Administered 2015-06-08 (×2): 20 meq
  Filled 2015-06-08 (×2): qty 15

## 2015-06-08 NOTE — Progress Notes (Signed)
Brief Nutrition Note  Consult received for enteral/tube feeding initiation and management.  Adult Enteral Nutrition 68M PEPUP Protocol initiated. Full assessment to follow.  Admitting Dx: Encounter for orogastric (OG) tube placement [Z46.59] Unresponsive [R40.4] Acute respiratory failure, unspecified whether with hypoxia or hypercapnia (HCC) [J96.00]  Body mass index is 22.61 kg/(m^2). Pt meets criteria for normal range based on current BMI.  Labs:   Recent Labs Lab 06/07/15 1302 06/08/15 0315  NA 130* 132*  K 4.3 3.3*  CL 91* 96*  CO2 29 28  BUN 8 9  CREATININE 0.33* 0.31*  CALCIUM 9.6 9.9  MG  --  1.5*  PHOS  --  2.3*  GLUCOSE 269* 173*    Curtis FrancoLindsey [name], MS, RD, LDN Pager: [phone] After Hours Pager: [phone]

## 2015-06-08 NOTE — Progress Notes (Signed)
Attempted to call son,  Unable to get an answer with number provided on chart.  RN notified.

## 2015-06-08 NOTE — H&P (Signed)
PULMONARY / CRITICAL CARE MEDICINE   Name: Curtis Burgess MRN: [id number] DOB: 01/26/1956    ADMISSION DATE:  06/07/2015  REFERRING MD:  EDP [name]([name])   CHIEF COMPLAINT:  Respiratory failure, AMS    SUBJECTIVE:   No acute change overnight.  Currently on PS 5/5.  C/o SOB.  Looks tired, tachypneic.    VITAL SIGNS: BP 152/88 mmHg  Pulse 111  Temp(Src) 98.3 F (36.8 C) (Oral)  Resp 25  Ht 6\' 2"  (1.88 m)  Wt 176 lb 2.4 oz (79.9 kg)  BMI 22.61 kg/m2  SpO2 94%  HEMODYNAMICS:    VENTILATOR SETTINGS: Vent Mode:  [-] PRVC FiO2 (%):  [50 %-100 %] 50 % Set Rate:  [18 bmp] 18 bmp Vt Set:  [650 mL-660 mL] 660 mL PEEP:  [5 cmH20] 5 cmH20 Plateau Pressure:  [20 cmH20-22 cmH20] 20 cmH20  INTAKE / OUTPUT: I/O last 3 completed shifts: In: 1771 [I.V.:719.7; NG/GT:351.3; IV Piggyback:700] Out: 575 [Urine:575]  PHYSICAL EXAMINATION: General:  Chronically ill appearing male, NAD on vent  Neuro:  Awake, alert, nods appropriately, MAE, significant gen weakness, minimal strength against gravity HEENT:  Mm moist, ETT, trach stoma essentially healed  Cardiovascular:  s1s2 rrr Lungs:  resps even non labored on vent, few scattered rhonchi  Abdomen:  Round, soft, non tender  Musculoskeletal:  Warm and dry, scant BLE edema  LABS:  BMET  Recent Labs Lab 06/07/15 1302 06/08/15 0315  NA 130* 132*  K 4.3 3.3*  CL 91* 96*  CO2 29 28  BUN 8 9  CREATININE 0.33* 0.31*  GLUCOSE 269* 173*    Electrolytes  Recent Labs Lab 06/07/15 1302 06/08/15 0315  CALCIUM 9.6 9.9  MG  --  1.5*  PHOS  --  2.3*    CBC  Recent Labs Lab 06/07/15 1302 06/08/15 0315  WBC 27.2* 15.4*  HGB 12.6* 11.9*  HCT 39.9 36.4*  PLT 438* 312    Coag's No results for input(s): APTT, INR in the last 168 hours.  Sepsis Markers  Recent Labs Lab 06/07/15 1330  LATICACIDVEN 2.10*    ABG  Recent Labs Lab 06/07/15 1400 06/08/15 0430  PHART 7.354 7.463*  PCO2ART 54.4* 34.7*  PO2ART 198.0*  89.0    Liver Enzymes  Recent Labs Lab 06/07/15 1302  AST 99*  ALT 79*  ALKPHOS 235*  BILITOT 0.3  ALBUMIN 2.6*    Cardiac Enzymes  Recent Labs Lab 06/07/15 1302  TROPONINI 0.04*    Glucose  Recent Labs Lab 06/07/15 1715 06/07/15 1959 06/08/15 0013 06/08/15 0358 06/08/15 0826  GLUCAP 184* 165* 222* 170* 220*    Imaging Ct Head Wo Contrast  06/07/2015  CLINICAL DATA:  Unresponsive patient. EXAM: CT HEAD WITHOUT CONTRAST TECHNIQUE: Contiguous axial images were obtained from the base of the skull through the vertex without intravenous contrast. COMPARISON:  None. FINDINGS: Ventricles and sulci are prominent compatible with atrophy. Periventricular and subcortical white matter hypodensity compatible with chronic small vessel ischemic changes. No evidence for acute cortically based infarct, intracranial hemorrhage, mass lesion or mass-effect. Orbits are unremarkable. Paranasal sinuses are well aerated. Mastoid air cells are unremarkable. Calvarium is intact. IMPRESSION: No acute intracranial process. Chronic small vessel ischemic changes. Electronically Signed   By: Annia Beltrew  Davis M.D.   On: 06/07/2015 14:03   Dg Chest Port 1 View  06/08/2015  CLINICAL DATA:  59 year old male with pneumonia. EXAM: PORTABLE CHEST 1 VIEW COMPARISON:  06/07/2015 FINDINGS: The cardiomediastinal silhouette is unchanged. An endotracheal tube with tip  2.7 cm above the carina and right PICC line with tip overlying the lower SVC again noted. Decreased left mid and lower lung airspace disease noted. Decreased right basilar opacity/atelectasis noted. There is no evidence of pneumothorax or pleural effusion. IMPRESSION: Improved left mid-lower lung airspace disease and right basilar atelectasis/ airspace disease. Electronically Signed   By: Harmon Pier M.D.   On: 06/08/2015 08:56   Dg Chest Portable 1 View  06/07/2015  CLINICAL DATA:  Patient is unresponsive. Evaluate endotracheal tube placement. EXAM:  PORTABLE CHEST 1 VIEW COMPARISON:  None. FINDINGS: ET tube terminates in the distal trachea, 1 cm superior to the carina. Right upper extremity PICC line tip projects over the superior vena cava. Stable cardiac and mediastinal contours. Left mid and lower lung heterogeneous pulmonary opacities. Heterogeneous opacities right lung base. Probable small left pleural effusion. IMPRESSION: Left mid and lower lung and right lung base heterogeneous opacities concerning for multi focal infection or aspiration in the appropriate clinical setting. ET tube terminates in the distal trachea.  Recommend retraction. Continued radiographic follow-up is recommended to ensure resolution of pulmonary findings. Electronically Signed   By: Annia Belt M.D.   On: 06/07/2015 13:59   Dg Abd Portable 1v  06/07/2015  CLINICAL DATA:  Evaluate OG tube placement EXAM: PORTABLE ABDOMEN - 1 VIEW COMPARISON:  None. FINDINGS: The orogastric tube tip is just below the GE junction and should be advanced. Right renal calculi noted. There is gaseous distension of the colon. IMPRESSION: Tip of OG tube is just below the GE junction and should be further advanced into the gastric lumen. Electronically Signed   By: Signa Kell M.D.   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  Suspect primary respiratory failure with hypercarbia, which resolved with mechanical ventilation.    ASSESSMENT / PLAN:  PULMONARY Acute on chronic respiratory failure  ?HCAP  P:   Vent support - tol PS for short periods only Likely needs re-do trach -- still very limited strength, failed 2 days post  decannulation  F/u CXR  Cont to treat possible HCAP with abx as above   CARDIOVASCULAR Hx HTN  Hx CAD  P:  Resume home norvasc, metoprolol  ASA   RENAL Hyponatremia  P:   F/u chem   GASTROINTESTINAL Dysphagia  P:   TF  PEG may still be necessary   HEMATOLOGIC Leukocytosis  P:  F/u cbc  SQ heparin   INFECTIOUS ?HCAP  P:   Vanc, cefepime as above  Sputum culture   ENDOCRINE DM    P:   SSI, low dose lantus    NEUROLOGIC AMS - acute.  Now resolved.  Unclear etiology.  Suspect acute respiratory failure r/t HCAP +/- residual neuromuscular weakness from guillain barre.  Doubt seizure, no known seizure hx.  Woke immediately,  Head CT neg.   Hx guillain barre  Hx bipolar  P:   RASS goal: 0 PRN fentanyl while on vent  Resume home bipolar meds    FAMILY  - Updates:  Pt updated, no family available 12/18    Dirk Dress, NP 06/08/2015  10:52 AM Pager: [phone] or [phone]   Attending Note:  I have examined patient, reviewed labs, studies and notes. I have discussed the case with Jasper Riling, and I agree with the data and plans as amended above. Hx GBS and chronic resp failure, now with quickly evolving acute failure post decannulation. On my eval he tolerated some PSV but soon after it was reported that he developed rapid and shallow breathing pattern. Suspect he will need repeat trach and intermittent ventilation long term. Will work on arranging trach this week.  Independent critical care time is 35 minutes.   Levy Pupa, MD, PhD 06/08/2015, 11:13 AM [facility] [phone] or if no answer [phone]

## 2015-06-08 NOTE — Progress Notes (Signed)
Peripherally Inserted Central Catheter/Midline Placement  The IV Nurse has discussed with the patient and/or persons authorized to consent for the patient, the purpose of this procedure and the potential benefits and risks involved with this procedure.  The benefits include less needle sticks, lab draws from the catheter and patient may be discharged home with the catheter.  Risks include, but not limited to, infection, bleeding, blood clot (thrombus formation), and puncture of an artery; nerve damage and irregular heat beat.  Alternatives to this procedure were also discussed.  Able to confirm pt is alert and oriented- nodding appropriately to questions and mouthing answers.  Denies any questions regarding PICC placement.  Spoke with Dr. Delton CoombesByrum- agrees pt is a&o and benefits from double lumen PICC. Attempted x3 on LUE brachial vein.  Basilic and cephalic measurement with >50 % catheter occupancy.    Able to access brachial vein without difficulty, but unable to thread guidewire beyond axillary area.  RUA PICC remains in place currently for IV needs.  If double lumen still desired please refer to IR.      Elliot DallyRiggs, Glyndon Tursi Wright 06/08/2015, 4:30 PM

## 2015-06-08 NOTE — Progress Notes (Signed)
Second attempt to contact son unsuccessful for PICC placement consent.

## 2015-06-09 DIAGNOSIS — J9600 Acute respiratory failure, unspecified whether with hypoxia or hypercapnia: Secondary | ICD-10-CM | POA: Insufficient documentation

## 2015-06-09 DIAGNOSIS — J069 Acute upper respiratory infection, unspecified: Secondary | ICD-10-CM | POA: Insufficient documentation

## 2015-06-09 DIAGNOSIS — R401 Stupor: Secondary | ICD-10-CM

## 2015-06-09 LAB — CBC WITH DIFFERENTIAL/PLATELET
Basophils Absolute: 0 10*3/uL (ref 0.0–0.1)
Basophils Relative: 0 %
Eosinophils Absolute: 0.1 10*3/uL (ref 0.0–0.7)
Eosinophils Relative: 1 %
HEMATOCRIT: 30.8 % — AB (ref 39.0–52.0)
HEMOGLOBIN: 9.8 g/dL — AB (ref 13.0–17.0)
LYMPHS ABS: 1.3 10*3/uL (ref 0.7–4.0)
LYMPHS PCT: 12 %
MCH: 27 pg (ref 26.0–34.0)
MCHC: 31.8 g/dL (ref 30.0–36.0)
MCV: 84.8 fL (ref 78.0–100.0)
MONO ABS: 0.8 10*3/uL (ref 0.1–1.0)
MONOS PCT: 7 %
NEUTROS ABS: 8.7 10*3/uL — AB (ref 1.7–7.7)
Neutrophils Relative %: 80 %
Platelets: 280 10*3/uL (ref 150–400)
RBC: 3.63 MIL/uL — ABNORMAL LOW (ref 4.22–5.81)
RDW: 14.2 % (ref 11.5–15.5)
WBC: 10.8 10*3/uL — ABNORMAL HIGH (ref 4.0–10.5)

## 2015-06-09 LAB — MAGNESIUM
MAGNESIUM: 1.7 mg/dL (ref 1.7–2.4)
MAGNESIUM: 1.6 mg/dL — AB (ref 1.7–2.4)
Magnesium: 1.8 mg/dL (ref 1.7–2.4)

## 2015-06-09 LAB — BASIC METABOLIC PANEL
Anion gap: 9 (ref 5–15)
BUN: 14 mg/dL (ref 6–20)
CHLORIDE: 98 mmol/L — AB (ref 101–111)
CO2: 27 mmol/L (ref 22–32)
Calcium: 9.6 mg/dL (ref 8.9–10.3)
Glucose, Bld: 177 mg/dL — ABNORMAL HIGH (ref 65–99)
Potassium: 3.1 mmol/L — ABNORMAL LOW (ref 3.5–5.1)
Sodium: 134 mmol/L — ABNORMAL LOW (ref 135–145)

## 2015-06-09 LAB — CBC
HEMATOCRIT: 30.8 % — AB (ref 39.0–52.0)
HEMOGLOBIN: 9.7 g/dL — AB (ref 13.0–17.0)
MCH: 26.9 pg (ref 26.0–34.0)
MCHC: 31.5 g/dL (ref 30.0–36.0)
MCV: 85.6 fL (ref 78.0–100.0)
Platelets: 288 10*3/uL (ref 150–400)
RBC: 3.6 MIL/uL — AB (ref 4.22–5.81)
RDW: 14.2 % (ref 11.5–15.5)
WBC: 12.2 10*3/uL — ABNORMAL HIGH (ref 4.0–10.5)

## 2015-06-09 LAB — GLUCOSE, CAPILLARY
GLUCOSE-CAPILLARY: 164 mg/dL — AB (ref 65–99)
GLUCOSE-CAPILLARY: 174 mg/dL — AB (ref 65–99)
GLUCOSE-CAPILLARY: 224 mg/dL — AB (ref 65–99)
Glucose-Capillary: 172 mg/dL — ABNORMAL HIGH (ref 65–99)
Glucose-Capillary: 128 mg/dL — ABNORMAL HIGH (ref 65–99)
Glucose-Capillary: 180 mg/dL — ABNORMAL HIGH (ref 65–99)

## 2015-06-09 LAB — VANCOMYCIN, TROUGH: VANCOMYCIN TR: 8 ug/mL — AB (ref 10.0–20.0)

## 2015-06-09 LAB — PHOSPHORUS
PHOSPHORUS: 5 mg/dL — AB (ref 2.5–4.6)
PHOSPHORUS: 2.4 mg/dL — AB (ref 2.5–4.6)
Phosphorus: 2 mg/dL — ABNORMAL LOW (ref 2.5–4.6)

## 2015-06-09 LAB — APTT: aPTT: 44 seconds — ABNORMAL HIGH (ref 24–37)

## 2015-06-09 LAB — PROTIME-INR
INR: 1.16 (ref 0.00–1.49)
PROTHROMBIN TIME: 15 s (ref 11.6–15.2)

## 2015-06-09 MED ORDER — VANCOMYCIN HCL 10 G IV SOLR
1250.0000 mg | Freq: Three times a day (TID) | INTRAVENOUS | Status: DC
  Administered 2015-06-09 – 2015-06-10 (×3): 1250 mg via INTRAVENOUS
  Filled 2015-06-09 (×5): qty 1250

## 2015-06-09 MED ORDER — FENTANYL CITRATE (PF) 100 MCG/2ML IJ SOLN
200.0000 ug | Freq: Once | INTRAMUSCULAR | Status: AC
  Administered 2015-06-10: 200 ug via INTRAVENOUS
  Filled 2015-06-09: qty 4

## 2015-06-09 MED ORDER — PROPOFOL 500 MG/50ML IV EMUL
5.0000 ug/kg/min | Freq: Once | INTRAVENOUS | Status: AC
  Administered 2015-06-10: 20 ug/kg/min via INTRAVENOUS
  Filled 2015-06-09: qty 50

## 2015-06-09 MED ORDER — VECURONIUM BROMIDE 10 MG IV SOLR
10.0000 mg | Freq: Once | INTRAVENOUS | Status: DC
  Filled 2015-06-09: qty 10

## 2015-06-09 MED ORDER — MAGNESIUM SULFATE IN D5W 10-5 MG/ML-% IV SOLN
1.0000 g | Freq: Once | INTRAVENOUS | Status: AC
  Administered 2015-06-09: 1 g via INTRAVENOUS
  Filled 2015-06-09: qty 100

## 2015-06-09 MED ORDER — POTASSIUM CHLORIDE 10 MEQ/50ML IV SOLN
10.0000 meq | INTRAVENOUS | Status: AC
  Administered 2015-06-09 (×3): 10 meq via INTRAVENOUS
  Filled 2015-06-09 (×3): qty 50

## 2015-06-09 MED ORDER — MIDAZOLAM HCL 2 MG/2ML IJ SOLN
4.0000 mg | Freq: Once | INTRAMUSCULAR | Status: AC
  Administered 2015-06-10: 2 mg via INTRAVENOUS
  Filled 2015-06-09: qty 4

## 2015-06-09 MED ORDER — SODIUM PHOSPHATE 3 MMOLE/ML IV SOLN
30.0000 mmol | Freq: Once | INTRAVENOUS | Status: AC
  Administered 2015-06-09: 30 mmol via INTRAVENOUS
  Filled 2015-06-09: qty 10

## 2015-06-09 MED ORDER — MAGNESIUM SULFATE 2 GM/50ML IV SOLN
2.0000 g | Freq: Once | INTRAVENOUS | Status: AC
  Administered 2015-06-09: 2 g via INTRAVENOUS
  Filled 2015-06-09: qty 50

## 2015-06-09 MED ORDER — PANTOPRAZOLE SODIUM 40 MG PO PACK
40.0000 mg | PACK | Freq: Every day | ORAL | Status: DC
  Administered 2015-06-09: 40 mg
  Filled 2015-06-09 (×3): qty 20

## 2015-06-09 MED ORDER — VITAL AF 1.2 CAL PO LIQD
1000.0000 mL | ORAL | Status: DC
  Administered 2015-06-09 – 2015-06-10 (×2): 1000 mL
  Filled 2015-06-09 (×4): qty 1000

## 2015-06-09 MED ORDER — ETOMIDATE 2 MG/ML IV SOLN
40.0000 mg | Freq: Once | INTRAVENOUS | Status: AC
  Administered 2015-06-10: 20 mg via INTRAVENOUS
  Filled 2015-06-09 (×2): qty 20

## 2015-06-09 NOTE — Progress Notes (Signed)
[facility]Link Physician-Brief Progress Note Patient Name: Curtis Burgess DOB: 07/18/1955 MRN: [id number]   Date of Service  06/09/2015  HPI/Events of Note  K+ = 3.1, PO4---, Mg++ = 1.7 and Creatinine < 0.3. PO4--- is being repleted now.   eICU Interventions  Replete K+ and Mg++.     Intervention Category Intermediate Interventions: Electrolyte abnormality - evaluation and management  Sommer,Steven Eugene 06/09/2015, 6:23 AM

## 2015-06-09 NOTE — Progress Notes (Signed)
Pharmacy Antibiotic Follow-up Note  Curtis Burgess is a 59 y.o. year-old male admitted on 06/07/2015.  The patient is currently on day 3 of Vancomycin +  Levaquin for HCAP  Assessment 59 YOM [facility] after becoming unresponsive while eating lunch. The patient has history of Guillain Barre with prolonged vent and trach placement. [facility] - the patient was started on Vancomycin + Levaquin for empiric HCAP coverage. A Vancomycin trough this evening resulted as SUBtherapeutic (VT 8 mcg/ml, goal of 15-20 mcg/ml). Will adjust the dose to better achieve target goal range.   Plan 1. Adjust Vancomycin to 1250 mg IV every 8 hours 2. Continue Levaquin at 750 mg IV every 24 hours 3. Will continue to follow renal function, culture results, LOT, and antibiotic de-escalation plans   Temp (24hrs), Avg:98.4 F (36.9 C), Min:97.5 F (36.4 C), Max:99.4 F (37.4 C)   Recent Labs Lab 06/07/15 1302 06/08/15 0315 06/09/15 0420 06/09/15 1030  WBC 27.2* 15.4* 12.2* 10.8*    Recent Labs Lab 06/07/15 1302 06/08/15 0315 06/09/15 0420  CREATININE 0.33* 0.31* <0.30*   CrCl cannot be calculated (Patient has no serum creatinine result on file.).    Allergies  Allergen Reactions  . Cephalosporins   . Eggs Or Egg-Derived Products Diarrhea    [facility]  . Gluten Meal Diarrhea    Per [facility] records  . Keflex [Cephalexin] Hives    [facility] records  . Milk-Related Compounds Diarrhea    [facility]  . Monosodium Glutamate Diarrhea    Per [facility] records  . Penicillins Other (See Comments)    Listed as an allergy by [facility] and [facility] records but no reaction given  . Latex Rash    [facility]    Antimicrobials this admission: Vanc 12/17 > Aztreonam 12/17>>12/19 Levaquin 12/17>>  Levels/dose changes this admission: 12/19: VT 8 mcg/ml on 1250 mg/12h >> adjusted to 1250  mg/8h  Microbiology results: 12/17 MRSA PCR >> neg 12/17 Blood Cx >> 1/2 GPC in clusters  Thank you for allowing pharmacy to be a part of this patient's care.  Georgina PillionElizabeth Chazz Philson, PharmD, BCPS Clinical Pharmacist Pager: [phone] 06/09/2015 4:55 PM

## 2015-06-09 NOTE — Progress Notes (Signed)
eLink Physician-Brief Progress Note Patient Name: Curtis Burgess DOB: 06/28/1955 MRN: [id number]   Date of Service  06/09/2015  HPI/Events of Note  PO4--- = 1.8, Mg++ = 1.8 and Creatinine = 0.31.  eICU Interventions  Will replete PO4--- and Mg++.     Intervention Category Intermediate Interventions: Electrolyte abnormality - evaluation and management  Sommer,Steven Eugene 06/09/2015, 3:22 AM

## 2015-06-09 NOTE — Progress Notes (Signed)
Initial Nutrition Assessment  DOCUMENTATION CODES:   Not applicable  INTERVENTION:    Continue TF via OGT, change to Vital AF 1.2 at 75 ml/h (1800 ml per day) to provide 2160 kcals, 135 gm protein, 1460 ml free water daily.  NUTRITION DIAGNOSIS:   Inadequate oral intake related to inability to eat as evidenced by NPO status.  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 Suspect primary respiratory failure with hypercarbia, which resolved with mechanical ventilation.   Patient may require re-trach.  Labs reviewed: sodium, potassium, phosphorus low.  Nutrition-Focused physical exam completed. Findings are no fat depletion, mild-moderate muscle depletion, and mild edema.  Received MD Consult for TF initiation and management.  Patient is currently receiving Vital High Protein via OGT at 40 ml/h (960 ml/day) with Prostat 30 ml BID to provide 1160 kcals, 114 gm protein, 803 ml free water daily.   Patient is currently intubated on ventilator support MV: 13.7 L/min Temp (24hrs), Avg:98.9 F (37.2 C), Min:98.2 F (36.8 C), Max:99.6 F (37.6 C)   Diet Order:  Diet NPO time specified Diet NPO time specified  Skin:  Reviewed, no issues  Last BM:  12/18  Height:   Ht Readings from Last 1 Encounters:  06/07/15 6\' 2"  (1.88 m)    Weight:   Wt Readings from Last 1 Encounters:  06/09/15 182 lb 12.2 oz (82.9 kg)    Ideal Body Weight:  86.4 kg  BMI:  Body mass index is 23.46 kg/(m^2).  Estimated Nutritional Needs:   Kcal:  2141  Protein:  125-150 gm  Fluid:  2.2 L  EDUCATION NEEDS:   No education needs identified at this time  Joaquin CourtsKimberly Harris, RD,  LDN, CNSC Pager [phone] After Hours Pager [phone]

## 2015-06-09 NOTE — H&P (Signed)
PULMONARY / CRITICAL CARE MEDICINE   Name: Curtis Burgess MRN: [id number] DOB: Sep 26, 1955    ADMISSION DATE:  06/07/2015  REFERRING MD:  EDP [name])   CHIEF COMPLAINT:  Respiratory failure, AMS    SUBJECTIVE:   ett remains, no pressors   VITAL SIGNS: BP 145/77 mmHg  Pulse 102  Temp(Src) 98.5 F (36.9 C) (Oral)  Resp 19  Ht  (1.88 m)  Wt 82.9 kg (182 lb 12.2 oz)  BMI 23.46 kg/m2  SpO2 100%  HEMODYNAMICS:    VENTILATOR SETTINGS: Vent Mode:  [-] PRVC FiO2 (%):  [50 %] 50 % Set Rate:  [18 bmp] 18 bmp Vt Set:  [660 mL] 660 mL PEEP:  [5 cmH20] 5 cmH20 Plateau Pressure:  [18 cmH20] 18 cmH20  INTAKE / OUTPUT: I/O last 3 completed shifts: In: 4910.7 [I.V.:1970.7; NG/GT:1280; IV Piggyback:1660] Out: 1595 [Urine:1595]  PHYSICAL EXAMINATION: General:  Chronically ill appearing male, NAD on vent  Neuro:  Awake, alert, very appropriate, significant gen weakness, minimal strength against gravity HEENT:  Mm moist, ETT, trach stoma essentially healed, no drainage Cardiovascular:  s1s2 rrr Lungs:  CTA Abdomen:  Round, soft, non tender  Musculoskeletal:  Warm and dry, scant BLE edema  LABS:  BMET  Recent Labs Lab 06/07/15 1302 06/08/15 0315 06/09/15 0420  NA 130* 132* 134*  K 4.3 3.3* 3.1*  CL 91* 96* 98*  CO2 BUN CREATININE 0.33* 0.31* <0.30*  GLUCOSE 269* 173* 177*    Electrolytes  Recent Labs Lab 06/07/15 1302  06/08/15 0315 06/08/15 0904 06/08/15 2005 06/09/15 0420  CALCIUM 9.6  --  9.9  --   --  9.6  MG  --   < > 1.5* 1.9 1.8 1.7  PHOS  --   < > 2.3* 2.2* 1.8* 2.0*  < > = values in this interval not displayed.  CBC  Recent Labs Lab 06/07/15 1302 06/08/15 0315 06/09/15 0420  WBC 27.2* 15.4* 12.2*  HGB 12.6* 11.9* 9.7*  HCT 39.9 36.4* 30.8*  PLT 438* 312 288    Coag's No results for input(s): APTT, INR in the last 168 hours.  Sepsis Markers  Recent Labs Lab 06/07/15 1330  LATICACIDVEN 2.10*     ABG  Recent Labs Lab 06/07/15 1400 06/08/15 0430  PHART 7.354 7.463*  PCO2ART 54.4* 34.7*  PO2ART 198.0* 89.0    Liver Enzymes  Recent Labs Lab 06/07/15 1302  AST 99*  ALT 79*  ALKPHOS 235*  BILITOT 0.3  ALBUMIN 2.6*    Cardiac Enzymes  Recent Labs Lab 06/07/15 1302  TROPONINI 0.04*    Glucose  Recent Labs Lab 06/08/15 0826 06/08/15 1254 06/08/15 1644 06/08/15 1947 06/08/15 2358 06/09/15 0406  GLUCAP 220* 204* 179* 136* 164* 172*    Imaging No results found.   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  Suspect primary respiratory failure with hypercarbia, which resolved with mechanical ventilation.    ASSESSMENT / PLAN:  PULMONARY Acute on chronic respiratory failure  HCAP  / aspiration likely, LLL P:   Appears to have had aspiration / hcap left base- just de cannulated, would re trach him in am  sbt ps  10, cpap 5, goal to reduce PS further pcxr clearing, repeat in am  May need MV recuction  CARDIOVASCULAR Hx HTN  Hx CAD  P:  norvasc, metoprolol  ASA   RENAL Hyponatremia, hypoK, hypomag, hypophos P:   F/u chem  k supp Saline at 50 can remain  GASTROINTESTINAL Dysphagia  P:   TF  PEG will be needed  HEMATOLOGIC Leukocytosis  P:  F/u cbc  SQ heparin   INFECTIOUS HCAP , concern aspiration P:   Sputum culture follow Dc aztreonam Maintain vanc, levo  ENDOCRINE DM    P:   SSI, low dose lantus   NEUROLOGIC AMS - acute.  Now resolved.  Unclear etiology.  Suspect acute respiratory failure r/t HCAP +/- residual neuromuscular weakness from guillain barre.  Doubt seizure,  no known seizure hx.  Woke immediately,  Head CT neg.   Hx guillain barre  Hx bipolar  P:   RASS goal: 0 PRN fentanyl while on vent  Resume home bipolar meds    FAMILY  - Updates:  Pt updated, no family available 12/18  Ccm time 30 min   Mcarthur Rossettianiel J. Tyson AliasFeinstein, MD, FACP Pgr: [phone] [facility]

## 2015-06-09 NOTE — Clinical Documentation Improvement (Signed)
Critical Care  Would you please further clarify the medical condition related to the clinical findings?   Sepsis - specify causative organism if known} = gram positive cocci in blood culture and Pneumonia diagnosed  Other  Clinically Undetermined  Document any associated diagnoses/conditions.   Supporting Information: Lactic acid = 2.1; WBC on admission was 15.4 and now 12.2; Pulse 103 and as high as 132.  Respirations - 27; altered mental status. GCS of 3. Has 500 cc bolus on 12/17 for tachycardia.   Please exercise your independent, professional judgment when responding. A specific answer is not anticipated or expected.   Thank Modesta MessingYou,  Cathren Sween L [facility] Management [place] [phone]

## 2015-06-10 ENCOUNTER — Inpatient Hospital Stay (HOSPITAL_COMMUNITY): Payer: Medicare Other

## 2015-06-10 ENCOUNTER — Inpatient Hospital Stay (HOSPITAL_COMMUNITY)
Admission: EM | Admit: 2015-06-10 | Discharge: 2015-06-11 | Disposition: A | Discharge status: Nursing Home (Includes ICF) | Payer: Medicare Other | Source: Other Acute Inpatient Hospital | Attending: Internal Medicine | Admitting: Internal Medicine

## 2015-06-10 DIAGNOSIS — I10 Essential (primary) hypertension: Secondary | ICD-10-CM

## 2015-06-10 DIAGNOSIS — R1310 Dysphagia, unspecified: Secondary | ICD-10-CM | POA: Diagnosis present

## 2015-06-10 DIAGNOSIS — Z794 Long term (current) use of insulin: Secondary | ICD-10-CM

## 2015-06-10 DIAGNOSIS — E876 Hypokalemia: Secondary | ICD-10-CM | POA: Diagnosis present

## 2015-06-10 DIAGNOSIS — E873 Alkalosis: Secondary | ICD-10-CM | POA: Diagnosis present

## 2015-06-10 DIAGNOSIS — Y95 Nosocomial condition: Secondary | ICD-10-CM

## 2015-06-10 DIAGNOSIS — L7622 Postprocedural hemorrhage and hematoma of skin and subcutaneous tissue following other procedure: Secondary | ICD-10-CM

## 2015-06-10 DIAGNOSIS — D689 Coagulation defect, unspecified: Secondary | ICD-10-CM

## 2015-06-10 DIAGNOSIS — J9622 Acute and chronic respiratory failure with hypercapnia: Secondary | ICD-10-CM | POA: Diagnosis present

## 2015-06-10 DIAGNOSIS — E119 Type 2 diabetes mellitus without complications: Secondary | ICD-10-CM

## 2015-06-10 DIAGNOSIS — J9819 Other pulmonary collapse: Secondary | ICD-10-CM | POA: Insufficient documentation

## 2015-06-10 DIAGNOSIS — Z79899 Other long term (current) drug therapy: Secondary | ICD-10-CM

## 2015-06-10 DIAGNOSIS — G9340 Encephalopathy, unspecified: Secondary | ICD-10-CM

## 2015-06-10 DIAGNOSIS — J9811 Atelectasis: Secondary | ICD-10-CM | POA: Diagnosis present

## 2015-06-10 DIAGNOSIS — F319 Bipolar disorder, unspecified: Secondary | ICD-10-CM | POA: Diagnosis present

## 2015-06-10 DIAGNOSIS — L8990 Pressure ulcer of unspecified site, unspecified stage: Secondary | ICD-10-CM | POA: Insufficient documentation

## 2015-06-10 DIAGNOSIS — D649 Anemia, unspecified: Secondary | ICD-10-CM | POA: Diagnosis present

## 2015-06-10 DIAGNOSIS — I2510 Atherosclerotic heart disease of native coronary artery without angina pectoris: Secondary | ICD-10-CM | POA: Diagnosis present

## 2015-06-10 DIAGNOSIS — J189 Pneumonia, unspecified organism: Secondary | ICD-10-CM | POA: Diagnosis present

## 2015-06-10 DIAGNOSIS — G610 Guillain-Barre syndrome: Secondary | ICD-10-CM

## 2015-06-10 DIAGNOSIS — L7682 Other postprocedural complications of skin and subcutaneous tissue: Secondary | ICD-10-CM | POA: Diagnosis present

## 2015-06-10 DIAGNOSIS — T17890A Other foreign object in other parts of respiratory tract causing asphyxiation, initial encounter: Secondary | ICD-10-CM | POA: Diagnosis present

## 2015-06-10 LAB — GLUCOSE, CAPILLARY
GLUCOSE-CAPILLARY: 153 mg/dL — AB (ref 65–99)
GLUCOSE-CAPILLARY: 162 mg/dL — AB (ref 65–99)
GLUCOSE-CAPILLARY: 174 mg/dL — AB (ref 65–99)
Glucose-Capillary: 111 mg/dL — ABNORMAL HIGH (ref 65–99)

## 2015-06-10 LAB — CULTURE, BLOOD (ROUTINE X 2)

## 2015-06-10 LAB — BASIC METABOLIC PANEL
Anion gap: 9 (ref 5–15)
BUN: 17 mg/dL (ref 6–20)
CHLORIDE: 100 mmol/L — AB (ref 101–111)
CO2: 28 mmol/L (ref 22–32)
Calcium: 9.5 mg/dL (ref 8.9–10.3)
Creatinine, Ser: <0.3 mg/dL — ABNORMAL LOW (ref 0.61–1.24)
GLUCOSE: 170 mg/dL — AB (ref 65–99)
POTASSIUM: 3.2 mmol/L — AB (ref 3.5–5.1)
Sodium: 137 mmol/L (ref 135–145)

## 2015-06-10 LAB — PHOSPHORUS: Phosphorus: 2.7 mg/dL (ref 2.5–4.6)

## 2015-06-10 LAB — MAGNESIUM: MAGNESIUM: 1.7 mg/dL (ref 1.7–2.4)

## 2015-06-10 MED ORDER — INSULIN ASPART 100 UNIT/ML ~~LOC~~ SOLN: 0.0000 [IU] | SUBCUTANEOUS | Status: AC

## 2015-06-10 MED ORDER — PANTOPRAZOLE SODIUM 40 MG IV SOLR: 40.0000 mg | INTRAVENOUS | Status: DC

## 2015-06-10 MED ORDER — FENTANYL CITRATE (PF) 100 MCG/2ML IJ SOLN: 25.0000 ug | INTRAMUSCULAR | Status: AC | PRN

## 2015-06-10 MED ORDER — SENNOSIDES 8.8 MG/5ML PO SYRP: 5.0000 mL | ORAL_SOLUTION | Freq: Two times a day (BID) | ORAL | Status: AC | PRN

## 2015-06-10 MED ORDER — ACETYLCYSTEINE 20 % IN SOLN: 4.0000 mL | Freq: Two times a day (BID) | RESPIRATORY_TRACT | Status: AC

## 2015-06-10 MED ORDER — POTASSIUM CHLORIDE 10 MEQ/50ML IV SOLN
10.0000 meq | INTRAVENOUS | Status: AC
  Administered 2015-06-10 (×4): 10 meq via INTRAVENOUS
  Filled 2015-06-10 (×3): qty 50

## 2015-06-10 MED ORDER — VITAL AF 1.2 CAL PO LIQD: 1000.0000 mL | ORAL | Status: AC

## 2015-06-10 MED ORDER — ALBUTEROL SULFATE (2.5 MG/3ML) 0.083% IN NEBU: 2.5000 mg | INHALATION_SOLUTION | RESPIRATORY_TRACT | Status: AC | PRN

## 2015-06-10 MED ORDER — VANCOMYCIN HCL 10 G IV SOLR: 1250.0000 mg | Freq: Three times a day (TID) | INTRAVENOUS | Status: AC

## 2015-06-10 MED ORDER — PROPOFOL 10 MG/ML IV BOLUS
INTRAVENOUS | Status: AC
  Filled 2015-06-10: qty 20

## 2015-06-10 MED ORDER — FENTANYL CITRATE (PF) 100 MCG/2ML IJ SOLN
25.0000 ug | INTRAMUSCULAR | Status: DC | PRN
  Administered 2015-06-11 (×5): 50 ug via INTRAVENOUS
  Filled 2015-06-10 (×5): qty 2

## 2015-06-10 MED ORDER — METOPROLOL TARTRATE 1 MG/ML IV SOLN: 5.0000 mg | Freq: Four times a day (QID) | INTRAVENOUS | Status: DC | PRN

## 2015-06-10 MED ORDER — MAGNESIUM SULFATE 4 GM/100ML IV SOLN
4.0000 g | Freq: Once | INTRAVENOUS | Status: AC
  Administered 2015-06-10: 4 g via INTRAVENOUS
  Filled 2015-06-10: qty 100

## 2015-06-10 MED ORDER — DEXTROSE 5 % IV SOLN
15.0000 mmol | Freq: Once | INTRAVENOUS | Status: AC
Start: 2015-06-10 — End: 2015-06-10
  Administered 2015-06-10: 15 mmol via INTRAVENOUS
  Filled 2015-06-10: qty 5

## 2015-06-10 MED ORDER — INSULIN GLARGINE 100 UNIT/ML ~~LOC~~ SOLN: 5.0000 [IU] | Freq: Every day | SUBCUTANEOUS | Status: AC

## 2015-06-10 MED ORDER — ALBUTEROL SULFATE (2.5 MG/3ML) 0.083% IN NEBU: 2.5000 mg | INHALATION_SOLUTION | RESPIRATORY_TRACT | Status: DC | PRN

## 2015-06-10 MED ORDER — DEXTROSE 5 % IV SOLN
10.0000 mmol | Freq: Once | INTRAVENOUS | Status: DC
  Filled 2015-06-10: qty 3.33

## 2015-06-10 MED ORDER — MAGNESIUM SULFATE 2 GM/50ML IV SOLN: 2.0000 g | Freq: Once | INTRAVENOUS | Status: DC

## 2015-06-10 MED ORDER — ACETYLCYSTEINE 20 % IN SOLN
4.0000 mL | Freq: Two times a day (BID) | RESPIRATORY_TRACT | Status: DC
  Administered 2015-06-10: 4 mL via RESPIRATORY_TRACT
  Filled 2015-06-10 (×2): qty 4

## 2015-06-10 MED ORDER — PROPOFOL 1000 MG/100ML IV EMUL
INTRAVENOUS | Status: AC
  Administered 2015-06-10: 20 ug/kg/min via INTRAVENOUS
  Filled 2015-06-10: qty 100

## 2015-06-10 MED ORDER — LEVOFLOXACIN IN D5W 750 MG/150ML IV SOLN: 750.0000 mg | INTRAVENOUS | Status: AC

## 2015-06-10 NOTE — Procedures (Signed)
Bedside Tracheostomy Insertion Procedure Note   Patient Details:   Name: Curtis Burgess DOB: 02/24/1956 MRN: [id number]  Procedure: Tracheostomy  Pre Procedure Assessment: ET Tube Size: 7.5 ET Tube secured at lip (cm): 25 Bite block in place: No Breath Sounds: Rhonch  Post Procedure Assessment: BP 140/88 mmHg  Pulse 106  Temp(Src) 98.6 F (37 C) (Oral)  Resp 31  Ht 6\' 2"  (1.88 m)  Wt 182 lb 5.1 oz (82.7 kg)  BMI 23.40 kg/m2  SpO2 97% O2 sats: stable throughout Complications: No apparent complications Patient did tolerate procedure well Tracheostomy Brand:Shiley Tracheostomy Style:Cuffed Tracheostomy Size: 6 Tracheostomy Secured WUJ:[id number]:Sutures, velcro Tracheostomy Placement Confirmation:Trach cuff visualized and in place and Chest X ray ordered for placement    Jacqulynn CadetHopper, Sweta Halseth David 06/10/2015, 9:52 AM

## 2015-06-10 NOTE — Progress Notes (Signed)
eLink Physician-Brief Progress Note Patient Name: Curtis Burgess DOB: 11/02/1955 MRN: [id number]   Date of Service  06/10/2015  HPI/Events of Note  [facility] recent [facility]. Will write some orders to bed patient down until the bedside team can get to admit him.   eICU Interventions  Will order: 1. Ventilator orders: 40%/PRVC 18/TV 660/P5. 2. ABG at 12:30 AM. 3. CBC with diff and platelets, PT/INR and PTT now. 4. Continuous oximetry. 5. Albuterol 2.5 mg via neb Q 2 hours PRN. 6. Fentanyl 25-50 mcg IV Q 2 hours PRN severe pain. 7. Place SCD's. 8. Protonix 40 mg IV now and Q day.      Intervention Category Major Interventions: Respiratory failure - evaluation and management Intermediate Interventions: Pain - evaluation and management  Sommer,Steven Eugene 06/10/2015, 11:44 PM

## 2015-06-10 NOTE — Progress Notes (Signed)
Received [facility] from [facility] and placed on hospital ventilator without complication. Patient's trach site and left side of neck where Central line was placed is soaked in blood. Suctioned patient and did not have any bloody secretions. RT will continue to monitor.

## 2015-06-10 NOTE — Progress Notes (Signed)
eLink Physician-Brief Progress Note Patient Name: Curtis Burgess DOB: 05/05/1956 MRN: [id number]   Date of Service  06/10/2015  HPI/Events of Note  K+ = 3.2, Mg++ = 1.6, PO4--- = 2.4 and Creatinine < 0.30.  eICU Interventions  Will replete K+. Mg++ and PO4---.     Intervention Category Intermediate Interventions: Electrolyte abnormality - evaluation and management  Eberardo Demello Eugene 06/10/2015, 6:04 AM

## 2015-06-10 NOTE — Progress Notes (Signed)
PULMONARY / CRITICAL CARE MEDICINE   Name: Curtis Burgess MRN: [id number] DOB: 03/16/1956    ADMISSION DATE:  06/07/2015  REFERRING MD:  EDP [name]([name])   CHIEF COMPLAINT:  Respiratory failure, AMS    SUBJECTIVE:   Scheduled to get re-trached today. Patient aware. Denies any discomfort, pain.    VITAL SIGNS: BP 146/88 mmHg  Pulse 91  Temp(Src) 98.6 F (37 C) (Oral)  Resp 18  Ht 6\' 2"  (1.88 m)  Wt 182 lb 5.1 oz (82.7 kg)  BMI 23.40 kg/m2  SpO2 96%  HEMODYNAMICS:    VENTILATOR SETTINGS: Vent Mode:  [-] PRVC FiO2 (%):  [40 %] 40 % Set Rate:  [18 bmp] 18 bmp Vt Set:  [650 mL-660 mL] 660 mL PEEP:  [5 cmH20] 5 cmH20 Pressure Support:  [5 cmH20-8 cmH20] 8 cmH20 Plateau Pressure:  [17 cmH20-21 cmH20] 17 cmH20  INTAKE / OUTPUT: I/O last 3 completed shifts: In: 5238.7 [I.V.:1986.7; NG/GT:1885; IV Piggyback:1367] Out: 1630 [Urine:1630]  PHYSICAL EXAMINATION: General:  Chronically ill appearing man, NAD on vent  Neuro:  Awake, alert, very appropriate, significant gen weakness HEENT: ETT in place, trach stoma essentially healed, no drainage CV: RRR, no m/g/r Lungs: rhonchi bilaterally Abdomen: BS+, soft, non-tender  Musculoskeletal:  Warm and dry, trace BLE edema  LABS:  BMET  Recent Labs Lab 06/08/15 0315 06/09/15 0420 06/10/15 0510  NA 132* 134* 137  K 3.3* 3.1* 3.2*  CL 96* 98* 100*  CO2 28 27 28   BUN 9 14 17   CREATININE 0.31* <0.30* <0.30*  GLUCOSE 173* 177* 170*    Electrolytes  Recent Labs Lab 06/08/15 0315  06/09/15 0420 06/09/15 1030 06/09/15 2104 06/10/15 0510  CALCIUM 9.9  --  9.6  --   --  9.5  MG 1.5*  < > 1.7 1.8 1.6*  --   PHOS 2.3*  < > 2.0* 5.0* 2.4*  --   < > = values in this interval not displayed.  CBC  Recent Labs Lab 06/08/15 0315 06/09/15 0420 06/09/15 1030  WBC 15.4* 12.2* 10.8*  HGB 11.9* 9.7* 9.8*  HCT 36.4* 30.8* 30.8*  PLT 312 288 280    Coag's  Recent Labs Lab 06/09/15 1030  APTT 44*  INR 1.16     Sepsis Markers  Recent Labs Lab 06/07/15 1330  LATICACIDVEN 2.10*    ABG  Recent Labs Lab 06/07/15 1400 06/08/15 0430  PHART 7.354 7.463*  PCO2ART 54.4* 34.7*  PO2ART 198.0* 89.0    Liver Enzymes  Recent Labs Lab 06/07/15 1302  AST 99*  ALT 79*  ALKPHOS 235*  BILITOT 0.3  ALBUMIN 2.6*    Cardiac Enzymes  Recent Labs Lab 06/07/15 1302  TROPONINI 0.04*    Glucose  Recent Labs Lab 06/09/15 1214 06/09/15 1524 06/09/15 2024 06/09/15 2356 06/10/15 0317 06/10/15 0754  GLUCAP 174* 128* 180* 162* 153* 174*    Imaging Dg Chest Port 1 View  06/10/2015  CLINICAL DATA:  Intubated patient, acute respiratory failure, Guillain-Barre syndrome. EXAM: PORTABLE CHEST 1 VIEW COMPARISON:  PA and lateral chest x-ray of June 08, 2015 FINDINGS: The lungs are well-expanded. Confluent alveolar opacity persists lateral to the left heart border. Increased interstitial infiltrate elsewhere in the left lung and in the right lung has decreased in conspicuity. The heart and pulmonary vascularity are normal. There is no pleural effusion or pneumothorax. The endotracheal tube tip lies 3.8 cm above the carina. The right-sided PICC line tip projects over the midportion of the SVC. The esophagogastric tube  tip projects below the inferior margin of the image. IMPRESSION: Slight interval improvement in bilateral interstitial infiltrates. Persistent left perihilar subsegmental atelectasis or alveolar pneumonia. Electronically Signed   By: David  [place] M.D.   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  Suspect primary respiratory failure with hypercarbia, which resolved with mechanical ventilation.    ASSESSMENT / PLAN:  PULMONARY Acute on chronic respiratory failure  HCAP  / aspiration likely, LLL Collapse BI right bronch 12/20 P:   Trach planned Add mucomysts x 48 hr chets pt pcxr now Peep 8  CARDIOVASCULAR Hx HTN  Hx CAD  P:  Continue norvasc, metoprolol  ASA   RENAL Hyponatremia- resolved HypoK Hypomag Hypophos P:   Electrolytes repleted Chem in AM k supp Saline at 50 to kvo Mag supp  GASTROINTESTINAL Dysphagia  P:   TF  Needs PEG  HEMATOLOGIC Leukocytosis  Normocytic Anemia P:  CBC in AM SQ heparin   INFECTIOUS HCAP, concern aspiration, collapse lung BI on bronch with trach P:   F/u Sputum culture Continue vanc, levo Blood cx>> GPC Follow new Bronch bal  ENDOCRINE DM    P:   SSI, low dose lantus   NEUROLOGIC AMS - acute.  Now resolved.  Unclear etiology.  Suspect acute respiratory failure r/t HCAP +/- residual neuromuscular weakness from guillain barre.  Doubt seizure, no known seizure hx.  Woke immediately,  Head CT neg.   Hx guillain barre  Hx bipolar  P:   RASS goal: 0 PRN fentanyl while on vent  Continue home bipolar meds    FAMILY  - Updates:  Pt updated, no family available 12/18, 12/19  Rich Number, MD, MPH Internal Medicine Resident, PGY-II Pager: [phone]  STAFF NOTE: Cindi Carbon, MD FACP have personally reviewed patient's available data, including medical history, events of note, physical examination and test results as part of my evaluation. I have discussed with resident/NP and other care providers such as pharmacist, RN and RRT. In addition, I personally evaluated patient and elicited key findings of:  Trach this am , await rapid fail wean this am , with trach noted collapse thick pus rt greater left , bal done, aspirated, increase peep, add  mucomysts, add chest pt, keep peep 8 overnight regardless of sats, keep vanc nd levo given allergy, can sbt in afternoon cpap 8 ps 10, dc sedation prost procedure, needs access, place line done, reaplce k, mag, phos, consider transfer to ltach now, get peg The patient is critically ill with multiple organ systems failure and requires high complexity decision making for assessment and support, frequent evaluation and titration of therapies, application of advanced monitoring technologies and extensive interpretation of multiple databases.   Critical Care Time devoted to patient care services described in this note is 30 Minutes. This time reflects time of care of this signee: Rory Percy, MD FACP. This critical care time does not reflect procedure time, or teaching time or supervisory time of PA/NP/Med student/Med Resident etc but could involve care discussion time. Rest per NP/medical resident whose note is outlined above and that I agree  with   Mcarthur Rossetti. Tyson Alias, MD, FACP Pgr: [phone] [place] [facility] 06/10/2015 10:33 AM

## 2015-06-10 NOTE — Procedures (Signed)
Bedside Bronchoscopy Procedure Note Curtis Burgess [id number] 02/22/1956  Procedure: Bronchoscopy following trach insertion Indications: Obtain specimens for culture and/or other diagnostic studies  Procedure Details: Trach size: 6 shiley Bite block in place: No In preparation for procedure, Patient hyper-oxygenated with 100 % FiO2 Airway entered and the following bronchi were examined: RUL, RML, RLL and Bronchi.   Bronchoscope removed.  , Patient remained on 100% FiO2 at throughout procedure.    Evaluation BP 140/88 mmHg  Pulse 106  Temp(Src) 98.6 F (37 C) (Oral)  Resp 31  Ht 6\' 2"  (1.88 m)  Wt 182 lb 5.1 oz (82.7 kg)  BMI 23.40 kg/m2  SpO2 97% Breath Sounds:Rhonch O2 sats: stable throughout Patient's Current Condition: stable Specimens:  Sent purulent fluid Complications: No apparent complications Patient did tolerate procedure well.   Curtis Burgess, Curtis Burgess Curtis Burgess 06/10/2015, 9:53 AM

## 2015-06-10 NOTE — Procedures (Signed)
Central Venous Catheter Insertion Procedure Note Curtis Burgess [id number] 04/24/1956  Procedure: Insertion of Central Venous Catheter Indications: Assessment of intravascular volume, Drug and/or fluid administration and Frequent blood sampling  Procedure Details Consent: Risks of procedure as well as the alternatives and risks of each were explained to the (patient/caregiver).  Consent for procedure obtained. Time Out: Verified patient identification, verified procedure, site/side was marked, verified correct patient position, special equipment/implants available, medications/allergies/relevent history reviewed, required imaging and test results available.  Performed  Maximum sterile technique was used including antiseptics, cap, gloves, gown, hand hygiene, mask and sheet. Skin prep: Chlorhexidine; local anesthetic administered A antimicrobial bonded/coated triple lumen catheter was placed in the left internal jugular vein using the Seldinger technique.  Evaluation Blood flow good Complications: No apparent complications Patient did tolerate procedure well. Chest X-ray ordered to verify placement.  CXR: pending.  Nelda BucksFEINSTEIN,Raydel Hosick J. 06/10/2015, 10:31 AM  US  Mcarthur Rossettianiel J. Tyson AliasFeinstein, MD, FACP Pgr: [phone] [place] [facility]

## 2015-06-10 NOTE — Procedures (Signed)
Name:  Curtis Burgess MRN:  [id number] DOB:  09/08/1955  OPERATIVE NOTE  Procedure:  Percutaneous tracheostomy - re do  Indications:  Ventilator-dependent respiratory failure.  Consent:  Procedure, alternatives, risks and benefits discussed with medical POA.  Questions answered.  Consent obtained.  Anesthesia:  Versed, fent, prop  Procedure summary:  Appropriate equipment was assembled.  The patient was identified as Curtis Burgess and safety timeout was performed. The patient was placed in supine position with a towel roll behind shoulder blades and neck extended.  Sterile technique was used. The patient's neck and upper chest were prepped using chlorhexidine / alcohol scrub and the field was draped in usual sterile fashion with full body drape. After the adequate sedation / anesthesia was achieved, attention was directed at the midline trachea, where the cricothyroid membrane was palpated. Approximately two fingerbreadths above the sternal notch on the old scar, a verticle incision was created with a scalpel after local infiltration with 0.2% Lidocaine. Then, using Seldinger technique and a percutaneous tracheostomy set, the trachea was entered with a 14 gauge needle with an overlying sheath. This was all confirmed under direct visualization of a fiberoptic flexible bronchoscope. Entrance into the trachea was identified through the third tracheal ring interspace. Following this, a guidewire was inserted. The needle was removed, leaving the sheath and the guidewire intact. Next, the sheath was removed and a small dilator was inserted. The tracheal rings were then dilated. A #6 Shiley was then opened. The balloon was checked. It was placed over a tracheal dilator, which was then advanced over the guidewire and through the previously dilated tract. The Shiley tracheostomy tube was noted to pass in the trachea with little resistance. The guidewire and dilator tubes were removed from the trachea. An  inner cannula was placed through the tracheostomy tube. The tracheostomy was then secured at the anterior neck with 4 monofilament sutures. The oral endotracheal tube was removed and the ventilator was attached to the newly placed tracheostomy tube. Adequate tidal volumes were noted. The cuff was inflated and no evidence of air leak was noted. No evidence of bleeding was noted. At this point, the procedure was concluded. Post-procedure chest x-ray was ordered.  Complications:  No immediate complications were noted.  Hemodynamic parameters and oxygenation remained stable throughout the procedure.  Estimated blood loss:  Less then 15 mL.  Nelda BucksFEINSTEIN,Caleb Prigmore J., MD Pulmonary and Critical Care Medicine [facility] Pager: [phone]  06/10/2015, 9:40 AM   Can follow up in trach clinic [phone]

## 2015-06-10 NOTE — Procedures (Signed)
Bronchoscopy Procedure Note #2 Curtis Gianottirthur Burgess [id number] 02/22/1956  Procedure: Bronchoscopy Indications: Obtain specimens for culture and/or other diagnostic studies and Remove secretions  Procedure Details Consent: Risks of procedure as well as the alternatives and risks of each were explained to the (patient/caregiver).  Consent for procedure obtained. Time Out: Verified patient identification, verified procedure, site/side was marked, verified correct patient position, special equipment/implants available, medications/allergies/relevent history reviewed, required imaging and test results available.  Performed  In preparation for procedure, patient was given 100% FiO2 and bronchoscope lubricated. Sedation: Etomidate  Airway entered and the following bronchi were examined: RUL, RML, RLL, LUL, LLL and Bronchi.   Procedures performed: Brushings performed - no Bronchoscope removed.  , Patient placed back on 100% FiO2 at conclusion of procedure.    Evaluation Hemodynamic Status: BP stable throughout; O2 sats: stable throughout Patient's Current Condition: stable Specimens:  Sent purulent fluid Complications: No apparent complications Patient did tolerate procedure well.   Nelda BucksFEINSTEIN,DANIEL J. 06/10/2015   1. Pus obstructing BI, RUL thick pus, and at jupper division, all suctioned 2. BAL RML , thick pus  Mcarthur Rossettianiel J. Tyson AliasFeinstein, MD, FACP Pgr: [phone] [place] [facility]

## 2015-06-10 NOTE — Care Management Note (Signed)
Case Management Note Donn PieriniKristi Emmamarie Kluender RN, BSN Unit 2W-Case Manager [phone] Covering 97M  Patient Details  Name: Myra Gianottirthur Robidoux MRN: [id number] Date of Birth: 04/30/1956  Subjective/Objective:   Pt admitted with resp. Distress/AMS                Action/Plan: [facility] on the LT[facility] side - have spoken with Sue Lushndrea with [facility] who [facility]. Per rounding MD- [facility]- Pt [place] on Chart - awaiting bed assignment at [facility]  Expected Discharge Date:    06/10/15              Expected Discharge Plan:  Long Term Acute Care (LTAC)  In-House Referral:     Discharge planning Services  CM Consult  Post Acute Care Choice:    Choice offered to:     DME Arranged:    DME Agency:     HH Arranged:    HH Agency:     Status of Service:  Completed, signed off  Medicare Important Message Given:    Date Medicare IM Given:    Medicare IM give by:    Date Additional Medicare IM Given:    Additional Medicare Important Message give by:     If discussed at Long Length of Stay Meetings, dates discussed:    Additional Comments:  Darrold SpanWebster, Dorthie Santini Hall, RN 06/10/2015, 2:08 PM

## 2015-06-10 NOTE — Discharge Summary (Signed)
Physician Discharge Summary     -  Patient ID: Curtis Burgess MRN: [id number] DOB/AGE: December 16, 1955 59 y.o.  Admit date: 06/07/2015 Discharge date: 06/10/2015  Discharge Diagnoses:   Acute on chronic respiratory failure  HCAP Possible aspiration Atelectasis w/ right sided Collapse  Hx HTN  Hx CAD  Hyponatremia- resolved HypoK Hypomag Hypophos Dysphagia  Leukocytosis  Normocytic Anemia HCAP Atelectasis  Acute encephalopathy (resolved)  Hx guillain barre  Hx bipolar    Detailed Hospital Course:   59yo male with hx HTN, CAD, DM recent prolonged illness r/t guillain Barre with prolonged vent course and trach placement at [employer]. He was tx to [facility], ultimately liberated from vent and was recently decannulated 2 days prior to admit. On 12/17 he passed swallow eval and was eating lunch when he suddenly became unresponsive. EMS was called and pt remained unresponsive with GSC 3, intubated in ER. He was taken to head CT and on returning from CT became suddenly responsive, appropriate, MAE. Has ongoing generalized weakness r/t GB. PCCM called to admit.  Pt c/o back and L leg pain. Denies any memory of earlier events. Denies SOB, chest pain, cough, hemoptysis, fevers, headache, lightheadedness, dysphagia today.  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 He was supported on mechanical ventilation, empiric antibiotics were initiated and cultures were sent. His blood cultures from his PICC line grew coag negative SA. Because of this his PICC line was removed and a left Internal jugular vein CVL was placed on 12/20.  The patient was stabilized and then later it was decided that the best course would be to go-ahead and re-do trach. This was completed on 12/20. During this event he had bronchoscopy to assist  w/ tracheostomy placement, as well as to evaluate the airway. It was noted during this evaluation he had extensive mucous plugging involving the RLL and right bronchus intermideus. He was lavaged and mucous was cleared. At this point we feel he is stable for transfer back to the LTAC setting where he can resume rehabilitation efforts.  Discharge Plan by active problems   Acute on chronic respiratory failure in setting of HCAP vs aspiration w/ resultant atx/ Collapse  tracheostomy dependence  Plan:  Cont rehab efforts Cont PT efforts  pcxr now Wean per protocol  Continue vanc and Levaquin (start date: 12/17) -->complete 8-10d rx  Hx HTN  Hx CAD  Plan:  Continue norvasc, metoprolol  ASA   Dysphagia  Plan:  Cont TF  Needs PEG  Leukocytosis  Normocytic Anemia Plan:  CBC in AM SQ heparin   HCAP, concern aspiration, collapse lung BI on bronch with trach P:  F/u Sputum culture Continue vanc, levo Blood cx>> GPC Follow new Bronch bal   DM  Plan:  SSI, low dose lantus   Residual neuromuscular weakness Hx guillain barre  Hx bipolar  Plan:  Continue home bipolar meds  Cont rehab efforts     Significant Hospital tests/ studies  STUDIES:  CT head 12/17>>> neg acute   CULTURES: Sputum 12/17>>> BCx2 12/17: coag neg SA (from PICC) Bal bronch 12/29>>  ANTIBIOTICS: vanc 12/17>>>  Aztreonam 12/17>>>12/19 Levaquin 12/17>>>  SIGNIFICANT EVENTS: 12/19- no pressors 12/20- trach planned, collapse on bronch rt  LINES/TUBES: ETT 12/17>>>12/20 Trach (feinstein #6 cuffed) 12/20>>> RUE PICC (pta) >>> Left IJ 12/20>>>  Discharge Exam: BP 124/81 mmHg  Pulse 95  Temp(Src) 98.7 F (37.1  C) (Oral)  Resp 18  Ht  (1.88 m)  Wt 82.7 kg (182 lb 5.1 oz)  BMI 23.40 kg/m2  SpO2 100%  General: Chronically ill appearing man, NAD on vent  Neuro: Awake, alert, very appropriate, significant gen weakness HEENT: ETT in place, trach stoma  essentially healed, no drainage CV: RRR, no m/g/r Lungs: rhonchi bilaterally Abdomen: BS+, soft, non-tender  Musculoskeletal: Warm and dry, trace BLE edema  Labs at discharge Lab Results  Component Value Date   CREATININE <0.30* 06/10/2015   BUN 17 06/10/2015   NA 137 06/10/2015   K 3.2* 06/10/2015   CL 100* 06/10/2015   CO2 28 06/10/2015   Lab Results  Component Value Date   WBC 10.8* 06/09/2015   HGB 9.8* 06/09/2015   HCT 30.8* 06/09/2015   MCV 84.8 06/09/2015   PLT 280 06/09/2015   Lab Results  Component Value Date   ALT 79* 06/07/2015   AST 99* 06/07/2015   ALKPHOS 235* 06/07/2015   BILITOT 0.3 06/07/2015   Lab Results  Component Value Date   INR 1.16 06/09/2015    Current radiology studies Dg Chest Port 1 View  06/10/2015  CLINICAL DATA:  Central line placement EXAM: PORTABLE CHEST 1 VIEW COMPARISON:  Prior film same day FINDINGS: Cardiomediastinal silhouette is stable. Streaky perihilar and infrahilar interstitial infiltrates again noted. Stable tracheostomy tube position and right arm PICC line position. There is new left IJ central line with tip in SVC. No pneumothorax. IMPRESSION: Streaky perihilar and infrahilar interstitial infiltrates again noted. Stable tracheostomy tube position and right arm PICC line position. There is new left IJ central line with tip in SVC. No pneumothorax. Electronically Signed   By: Natasha Mead M.D.   On: 06/10/2015 10:55   Dg Chest Port 1 View  06/10/2015  CLINICAL DATA:  Tracheostomy placement EXAM: PORTABLE CHEST 1 VIEW COMPARISON:  06/10/2015 FINDINGS: Tracheostomy tube in place with tip 5.2 cm above the carina. No pneumothorax. Stable right arm PICC line position with tip in SVC. Persistent bilateral patchy infiltrates left perihilar and right infrahilar region. No convincing pulmonary edema. Endotracheal has been removed. IMPRESSION: Tracheostomy tube is in place. No pneumothorax. Stable right arm PICC line position. Persistent  bilateral patchy infiltrates. Electronically Signed   By: Natasha Mead M.D.   On: 06/10/2015 10:10   Dg Chest Port 1 View  06/10/2015  CLINICAL DATA:  Intubated patient, acute respiratory failure, Guillain-Barre syndrome. EXAM: PORTABLE CHEST 1 VIEW COMPARISON:  PA and lateral chest x-ray of June 08, 2015 FINDINGS: The lungs are well-expanded. Confluent alveolar opacity persists lateral to the left heart border. Increased interstitial infiltrate elsewhere in the left lung and in the right lung has decreased in conspicuity. The heart and pulmonary vascularity are normal. There is no pleural effusion or pneumothorax. The endotracheal tube tip lies 3.8 cm above the carina. The right-sided PICC line tip projects over the midportion of the SVC. The esophagogastric tube tip projects below the inferior margin of the image. IMPRESSION: Slight interval improvement in bilateral interstitial infiltrates. Persistent left perihilar subsegmental atelectasis or alveolar pneumonia. Electronically Signed   By: David  [place] M.D.   On: 06/10/2015 07:32    Disposition:  Final discharge disposition not confirmed     Medication List    STOP taking these medications        insulin lispro 100 UNIT/ML injection  Commonly known as:  HUMALOG      TAKE these medications  acetaminophen 325 MG tablet  Commonly known as:  TYLENOL  Take 650 mg by mouth every 6 (six) hours as needed (pain).     acetylcysteine 20 % nebulizer solution  Commonly known as:  MUCOMYST  Take 4 mLs by nebulization 2 (two) times daily.     albuterol (2.5 MG/3ML) 0.083% nebulizer solution  Commonly known as:  PROVENTIL  Take 3 mLs (2.5 mg total) by nebulization every 2 (two) hours as needed for wheezing.     amLODipine 10 MG tablet  Commonly known as:  NORVASC  Take 10 mg by mouth daily.     atorvastatin 40 MG tablet  Commonly known as:  LIPITOR  Take 40 mg by mouth at bedtime.     carbamazepine 200 MG tablet  Commonly  known as:  TEGRETOL  Take 200 mg by mouth 3 (three) times daily.     cholecalciferol 1000 UNITS tablet  Commonly known as:  VITAMIN D  Take 2,000 Units by mouth daily.     clobetasol ointment 0.05 %  Commonly known as:  TEMOVATE  Apply 1 application topically 2 (two) times daily.     dextrose 5 % solution  Inject into the vein See admin instructions. Use 1 liter intravenously every shift for hydration at 50 ml/hr continuous     enoxaparin 40 MG/0.4ML injection  Commonly known as:  LOVENOX  Inject 40 mg into the skin daily.     escitalopram 20 MG tablet  Commonly known as:  LEXAPRO  Take 20 mg by mouth at bedtime.     feeding supplement (VITAL AF 1.2 CAL) Liqd  Place 1,000 mLs into feeding tube continuous.     fentaNYL 100 MCG/2ML injection  Commonly known as:  SUBLIMAZE  Inject 0.5-1 mLs (25-50 mcg total) into the vein every 2 (two) hours as needed for severe pain.     insulin aspart 100 UNIT/ML injection  Commonly known as:  novoLOG  Inject 0-15 Units into the skin every 4 (four) hours.     insulin glargine 100 UNIT/ML injection  Commonly known as:  LANTUS  Inject 0.05 mLs (5 Units total) into the skin at bedtime.     lamoTRIgine 150 MG tablet  Commonly known as:  LAMICTAL  Take 150 mg by mouth daily.     levofloxacin 750 MG/150ML Soln  Commonly known as:  LEVAQUIN  Inject 150 mLs (750 mg total) into the vein daily.     metoprolol tartrate 25 MG tablet  Commonly known as:  LOPRESSOR  Take 12.5 mg by mouth 2 (two) times daily.     nortriptyline 50 MG capsule  Commonly known as:  PAMELOR  Take 50 mg by mouth at bedtime.     omeprazole 20 MG capsule  Commonly known as:  PRILOSEC  Take 20 mg by mouth daily.     ondansetron 4 MG tablet  Commonly known as:  ZOFRAN  Take 4 mg by mouth every 6 (six) hours as needed for nausea or vomiting.     polyethylene glycol packet  Commonly known as:  MIRALAX / GLYCOLAX  Take 17 g by mouth every 12 (twelve) hours as needed  (constipation).     pregabalin 25 MG capsule  Commonly known as:  LYRICA  Take 25 mg by mouth 3 (three) times daily.     primidone 50 MG tablet  Commonly known as:  MYSOLINE  Take 50 mg by mouth 3 (three) times daily. For bipolar     sennosides 8.8  MG/5ML syrup  Commonly known as:  SENOKOT  Place 5 mLs into feeding tube 2 (two) times daily as needed for mild constipation.     traMADol 50 MG tablet  Commonly known as:  ULTRAM  Take 50 mg by mouth every 6 (six) hours as needed (pain).     traZODone 50 MG tablet  Commonly known as:  DESYREL  Take 50 mg by mouth at bedtime.     vancomycin 1,250 mg in sodium chloride 0.9 % 250 mL  Inject 1,250 mg into the vein every 8 (eight) hours.     vitamin B-12 1000 MCG tablet  Commonly known as:  CYANOCOBALAMIN  Take 1,000 mcg by mouth daily.         Discharged Condition: good  Physician Statement:   The Patient was personally examined, the discharge assessment and plan has been personally reviewed and I agree with ACNP Pasqualino Witherspoon's assessment and plan. > 30 minutes of time have been dedicated to discharge assessment, planning and discharge instructions.   Signed: Shelby Mattocks 06/10/2015, 2:33 PM

## 2015-06-10 NOTE — Procedures (Signed)
Bronchoscopy  for Percutaneous  Tracheostomy  Name: Curtis Burgess MRN: [id number] DOB: 04/05/1956 Procedure: Bronchoscopy for Percutaneous Tracheostomy Indications: Diagnostic evaluation of the airways In conjunction with: Dr. Tyson AliasFeinstein with Brett CanalesSteve Minor ACNP as bronchoscope operator for tracheostomy.   Procedure Details Consent: Risks of procedure as well as the alternatives and risks of each were explained to the (patient/caregiver).  Consent for procedure obtained. Time Out: Verified patient identification, verified procedure, site/side was marked, verified correct patient position, special equipment/implants available, medications/allergies/relevent history reviewed, required imaging and test results available.  Performed  In preparation for procedure, patient was given 100% FiO2 and bronchoscope lubricated. Sedation: Benzodiazepines and Etomidate  Airway entered and the following bronchi were examined: RML.   Procedures performed: Endotracheal Tube retracted in 2 cm increments. Cannulation of airway observed. Dilation observed. Placement of trachel tube  observed . No overt complications. Bronchoscope removed.    Evaluation Hemodynamic Status: BP stable throughout; O2 sats: stable throughout Patient's Current Condition: stable Specimens:  None Complications: No apparent complications Patient did tolerate procedure well.   Brett CanalesSteve Minor ACNP [facility] Pager [phone] till 3 pm If no answer page [phone] 06/10/2015, 9:27 AM    i perfromed with Seteve, tolerated well. Noted all placement trach  No post weall injury  Mcarthur Rossettianiel J. Tyson AliasFeinstein, MD, FACP Pgr: [phone] [facility]

## 2015-06-11 ENCOUNTER — Other Ambulatory Visit (HOSPITAL_COMMUNITY): Payer: Self-pay

## 2015-06-11 ENCOUNTER — Inpatient Hospital Stay
Admission: AD | Admit: 2015-06-11 | Discharge: 2015-07-16 | Disposition: A | Discharge status: Skilled Nursing Facility | Payer: Medicare Other | Source: Ambulatory Visit | Attending: Internal Medicine | Admitting: Internal Medicine

## 2015-06-11 ENCOUNTER — Encounter (HOSPITAL_COMMUNITY): Payer: Self-pay | Admitting: Pulmonary Disease

## 2015-06-11 DIAGNOSIS — J9690 Respiratory failure, unspecified, unspecified whether with hypoxia or hypercapnia: Secondary | ICD-10-CM

## 2015-06-11 DIAGNOSIS — Z4659 Encounter for fitting and adjustment of other gastrointestinal appliance and device: Secondary | ICD-10-CM

## 2015-06-11 DIAGNOSIS — L8990 Pressure ulcer of unspecified site, unspecified stage: Secondary | ICD-10-CM | POA: Insufficient documentation

## 2015-06-11 DIAGNOSIS — J9620 Acute and chronic respiratory failure, unspecified whether with hypoxia or hypercapnia: Secondary | ICD-10-CM

## 2015-06-11 DIAGNOSIS — D689 Coagulation defect, unspecified: Secondary | ICD-10-CM

## 2015-06-11 DIAGNOSIS — J9621 Acute and chronic respiratory failure with hypoxia: Secondary | ICD-10-CM

## 2015-06-11 DIAGNOSIS — J189 Pneumonia, unspecified organism: Secondary | ICD-10-CM

## 2015-06-11 DIAGNOSIS — J151 Pneumonia due to Pseudomonas: Secondary | ICD-10-CM

## 2015-06-11 DIAGNOSIS — R7881 Bacteremia: Secondary | ICD-10-CM

## 2015-06-11 DIAGNOSIS — L7682 Other postprocedural complications of skin and subcutaneous tissue: Secondary | ICD-10-CM

## 2015-06-11 DIAGNOSIS — Z930 Tracheostomy status: Secondary | ICD-10-CM

## 2015-06-11 DIAGNOSIS — Z9911 Dependence on respirator [ventilator] status: Secondary | ICD-10-CM

## 2015-06-11 DIAGNOSIS — J986 Disorders of diaphragm: Secondary | ICD-10-CM

## 2015-06-11 DIAGNOSIS — E876 Hypokalemia: Secondary | ICD-10-CM

## 2015-06-11 DIAGNOSIS — K567 Ileus, unspecified: Secondary | ICD-10-CM

## 2015-06-11 DIAGNOSIS — G610 Guillain-Barre syndrome: Secondary | ICD-10-CM

## 2015-06-11 DIAGNOSIS — R1310 Dysphagia, unspecified: Secondary | ICD-10-CM

## 2015-06-11 DIAGNOSIS — Z431 Encounter for attention to gastrostomy: Secondary | ICD-10-CM

## 2015-06-11 LAB — BLOOD GAS, ARTERIAL
Acid-Base Excess: 4.5 mmol/L — ABNORMAL HIGH (ref 0.0–2.0)
Bicarbonate: 26.8 mEq/L — ABNORMAL HIGH (ref 20.0–24.0)
DRAWN BY: 437071
FIO2: 0.4
MECHVT: 660 mL
O2 SAT: 96.6 %
PCO2 ART: 28.5 mmHg — AB (ref 35.0–45.0)
PEEP: 5 cmH2O
PH ART: 7.58 — AB (ref 7.350–7.450)
Patient temperature: 98.7
RATE: 18 resp/min
TCO2: 27.7 mmol/L (ref 0–100)
pO2, Arterial: 74.8 mmHg — ABNORMAL LOW (ref 80.0–100.0)

## 2015-06-11 LAB — RENAL FUNCTION PANEL
ANION GAP: 7 (ref 5–15)
Albumin: 1.5 g/dL — ABNORMAL LOW (ref 3.5–5.0)
BUN: 13 mg/dL (ref 6–20)
CHLORIDE: 103 mmol/L (ref 101–111)
CO2: 26 mmol/L (ref 22–32)
Calcium: 9.1 mg/dL (ref 8.9–10.3)
Creatinine, Ser: <0.3 mg/dL — ABNORMAL LOW (ref 0.61–1.24)
Glucose, Bld: 113 mg/dL — ABNORMAL HIGH (ref 65–99)
PHOSPHORUS: 3.3 mg/dL (ref 2.5–4.6)
POTASSIUM: 3.4 mmol/L — AB (ref 3.5–5.1)
Sodium: 136 mmol/L (ref 135–145)

## 2015-06-11 LAB — GLUCOSE, CAPILLARY
GLUCOSE-CAPILLARY: 133 mg/dL — AB (ref 65–99)
GLUCOSE-CAPILLARY: 123 mg/dL — AB (ref 65–99)
GLUCOSE-CAPILLARY: 103 mg/dL — AB (ref 65–99)
Glucose-Capillary: 109 mg/dL — ABNORMAL HIGH (ref 65–99)
Glucose-Capillary: 110 mg/dL — ABNORMAL HIGH (ref 65–99)

## 2015-06-11 LAB — TYPE AND SCREEN
ABO/RH(D): B POS
ANTIBODY SCREEN: NEGATIVE

## 2015-06-11 LAB — CBC WITH DIFFERENTIAL/PLATELET
Basophils Absolute: 0 10*3/uL (ref 0.0–0.1)
Basophils Relative: 0 %
EOS PCT: 2 %
Eosinophils Absolute: 0.2 10*3/uL (ref 0.0–0.7)
HCT: 27.8 % — ABNORMAL LOW (ref 39.0–52.0)
Hemoglobin: 9.1 g/dL — ABNORMAL LOW (ref 13.0–17.0)
LYMPHS ABS: 1.8 10*3/uL (ref 0.7–4.0)
LYMPHS PCT: 21 %
MCH: 27.7 pg (ref 26.0–34.0)
MCHC: 32.7 g/dL (ref 30.0–36.0)
MCV: 84.5 fL (ref 78.0–100.0)
MONOS PCT: 8 %
Monocytes Absolute: 0.7 10*3/uL (ref 0.1–1.0)
Neutro Abs: 6.2 10*3/uL (ref 1.7–7.7)
Neutrophils Relative %: 69 %
PLATELETS: 298 10*3/uL (ref 150–400)
RBC: 3.29 MIL/uL — AB (ref 4.22–5.81)
RDW: 14.4 % (ref 11.5–15.5)
WBC: 8.9 10*3/uL (ref 4.0–10.5)

## 2015-06-11 LAB — FIBRINOGEN: FIBRINOGEN: 679 mg/dL — AB (ref 204–475)

## 2015-06-11 LAB — PROTIME-INR
INR: 1.09 (ref 0.00–1.49)
PROTHROMBIN TIME: 14.3 s (ref 11.6–15.2)

## 2015-06-11 LAB — APTT: APTT: 40 s — AB (ref 24–37)

## 2015-06-11 LAB — VANCOMYCIN, RANDOM: Vancomycin Rm: 15 ug/mL

## 2015-06-11 LAB — MAGNESIUM: MAGNESIUM: 1.9 mg/dL (ref 1.7–2.4)

## 2015-06-11 LAB — ABO/RH: ABO/RH(D): B POS

## 2015-06-11 MED ORDER — CARBAMAZEPINE 100 MG/5ML PO SUSP
200.0000 mg | Freq: Three times a day (TID) | ORAL | Status: DC
  Administered 2015-06-11: 200 mg
  Filled 2015-06-11 (×3): qty 10

## 2015-06-11 MED ORDER — VITAMIN B-12 1000 MCG PO TABS
1000.0000 ug | ORAL_TABLET | Freq: Every day | ORAL | Status: DC
  Administered 2015-06-11: 1000 ug
  Filled 2015-06-11: qty 1

## 2015-06-11 MED ORDER — LEVOFLOXACIN IN D5W 750 MG/150ML IV SOLN
750.0000 mg | INTRAVENOUS | Status: DC
  Administered 2015-06-11: 750 mg via INTRAVENOUS
  Filled 2015-06-11 (×2): qty 150

## 2015-06-11 MED ORDER — LAMOTRIGINE 150 MG PO TABS
150.0000 mg | ORAL_TABLET | Freq: Every day | ORAL | Status: DC
  Administered 2015-06-11: 150 mg
  Filled 2015-06-11: qty 1

## 2015-06-11 MED ORDER — ATORVASTATIN CALCIUM 40 MG PO TABS
40.0000 mg | ORAL_TABLET | Freq: Every day | ORAL | Status: DC
  Filled 2015-06-11: qty 1

## 2015-06-11 MED ORDER — "THROMBI-PAD 3""X3"" EX PADS": 2.0000 | MEDICATED_PAD | Freq: Once | CUTANEOUS | Status: AC

## 2015-06-11 MED ORDER — "THROMBI-PAD 3""X3"" EX PADS"
2.0000 | MEDICATED_PAD | Freq: Once | CUTANEOUS | Status: DC
  Filled 2015-06-11: qty 2

## 2015-06-11 MED ORDER — VANCOMYCIN HCL 10 G IV SOLR
1250.0000 mg | Freq: Three times a day (TID) | INTRAVENOUS | Status: DC
  Administered 2015-06-11 (×2): 1250 mg via INTRAVENOUS
  Filled 2015-06-11 (×4): qty 1250

## 2015-06-11 MED ORDER — PREGABALIN 25 MG PO CAPS
25.0000 mg | ORAL_CAPSULE | Freq: Three times a day (TID) | ORAL | Status: DC
  Administered 2015-06-11: 25 mg
  Filled 2015-06-11: qty 1

## 2015-06-11 MED ORDER — METOPROLOL TARTRATE 25 MG/10 ML ORAL SUSPENSION
12.5000 mg | Freq: Two times a day (BID) | ORAL | Status: DC
  Administered 2015-06-11 (×2): 12.5 mg
  Filled 2015-06-11 (×3): qty 5

## 2015-06-11 MED ORDER — TRAZODONE HCL 50 MG PO TABS
50.0000 mg | ORAL_TABLET | Freq: Every day | ORAL | Status: DC
  Administered 2015-06-11: 50 mg
  Filled 2015-06-11 (×2): qty 1

## 2015-06-11 MED ORDER — ESCITALOPRAM OXALATE 20 MG PO TABS
20.0000 mg | ORAL_TABLET | Freq: Every day | ORAL | Status: DC
  Administered 2015-06-11: 20 mg
  Filled 2015-06-11 (×2): qty 1

## 2015-06-11 MED ORDER — AMLODIPINE 1 MG/ML ORAL SUSPENSION
10.0000 mg | Freq: Every day | ORAL | Status: DC
  Administered 2015-06-11: 10 mg
  Filled 2015-06-11 (×3): qty 10

## 2015-06-11 MED ORDER — PANTOPRAZOLE SODIUM 40 MG PO PACK
40.0000 mg | PACK | Freq: Every day | ORAL | Status: DC
  Administered 2015-06-11: 40 mg
  Filled 2015-06-11: qty 20

## 2015-06-11 MED ORDER — NORTRIPTYLINE HCL 10 MG/5ML PO SOLN
50.0000 mg | Freq: Every day | ORAL | Status: DC
  Filled 2015-06-11: qty 25

## 2015-06-11 MED ORDER — SODIUM CHLORIDE 0.9 % IV SOLN
250.0000 mL | INTRAVENOUS | Status: DC | PRN
  Administered 2015-06-11: 250 mL via INTRAVENOUS

## 2015-06-11 MED ORDER — INSULIN ASPART 100 UNIT/ML ~~LOC~~ SOLN
0.0000 [IU] | SUBCUTANEOUS | Status: DC
  Administered 2015-06-11: 1 [IU] via SUBCUTANEOUS

## 2015-06-11 MED ORDER — NORTRIPTYLINE HCL 25 MG PO CAPS
50.0000 mg | ORAL_CAPSULE | Freq: Every day | ORAL | Status: DC
  Administered 2015-06-11: 50 mg
  Filled 2015-06-11 (×2): qty 2

## 2015-06-11 MED ORDER — PRIMIDONE 50 MG PO TABS
50.0000 mg | ORAL_TABLET | Freq: Three times a day (TID) | ORAL | Status: DC
  Administered 2015-06-11 (×3): 50 mg
  Filled 2015-06-11 (×5): qty 1

## 2015-06-11 NOTE — Progress Notes (Signed)
Patient is demonstrating very moderate amounts of bright red blood when orally and tracheally suctioned. RN aware. RT to continue to monitor.

## 2015-06-11 NOTE — Procedures (Signed)
Indication:  Bleeding Around Central Venous Catheter Insertion Site  Description of Procedure:  Patient was laid semi-recumbent. Utilizing sterile gloves and 4 x 4 gauze significant clot was removed from around tracheostomy site and dressing from left internal jugular central venous catheter. There was no bleeding from around tracheostomy insertion site. The patient did have a slow oozing from around site of insertion of central venous catheter. After removal of remaining clot and application of pressure Surgicel was applied directly to the insertion site with pressure and successful halt of blood flow, at least temporarily. Nurse to apply sterile dressing over catheter.

## 2015-06-11 NOTE — Care Management Note (Signed)
Case Management Note  Patient Details  Name: Curtis Burgess MRN: [id number] Date of Birth: 02/15/1956  Subjective[facility]Objective:    [facility]                Action/Plan: Plan is to dc pt back to [facility], [facility]. NCM spoke to pt's son, Clyde LundborgJoshua Vigna [phone]. Son is in agreement with dc to [facility]. [facility] can accept referral for LTAC. Scheduled dc to [facility] 06/11/2015.  Expected Discharge Date:  06/11/2015              Expected Discharge Plan:  Long Term Acute Care (LTAC)  In-House Referral:  NA  Discharge planning Services  CM Consult  Post Acute Care Choice:  NA Choice offered to:  NA  DME Arranged:  N/A DME Agency:  NA  HH Arranged:  NA HH Agency:  NA  Status of Service:  Completed, signed off  Medicare Important Message Given:    Date Medicare IM Given:    Medicare IM give by:    Date Additional Medicare IM Given:    Additional Medicare Important Message give by:     If discussed at Long Length of Stay Meetings, dates discussed:    Additional Comments:  Elliot CousinShavis, Feliza Diven Ellen, RN 06/11/2015, 10:59 AM

## 2015-06-11 NOTE — Progress Notes (Signed)
Trach ties changed at bedside with Gaetano HawthorneAshley Brooker, RRT, and RN at bedside. No complications. RT will continue to monitor.

## 2015-06-11 NOTE — H&P (Signed)
PULMONARY / CRITICAL CARE MEDICINE   Name: Curtis Burgess MRN: [id number] DOB: June 26, 1955    ADMISSION DATE:  06/10/2015 CONSULTATION DATE:  06/11/2015  REFERRING MD:  [facility]  CHIEF COMPLAINT:  Bleeding from Tracheostomy Site  STUDIES:  CT Head 12/17:  No acute abnormalities. Port CXR 12/20:   Patchy bilateral hilar opacities. Trach in place. Left IJ CVL with tip in SVC.  CULTURES: BAL 12/20>>> Blood 12/17:  Coag Neg Staph 1/2 bottles (from PICC)  ANTIBIOTICS: Vancomycin 12/17>> Levaquin 12/17>> Aztreonam 12/17 - 12/19  SIGNIFICANT EVENTS: 12/17 - Admit  12/20 - Left IJ Placement / Trach Placement / Discharge to LTAC 12/20 - Readmit for Bleeding around trach  LINES/TUBES: Trach by DF (#6 cuffed) 12/20>> Left IJ CVL 12/20>> RUE PICC (prior to arrival)>> NGT>> OETT 12/17 - 12/20  HISTORY OF PRESENT ILLNESS:   58yo male with hx HTN, CAD, DM recent prolonged illness r/t guillain Barre with prolonged vent course and trach placement at [employer]. He was tx to [facility], ultimately liberated from vent and was recently decannulated 2 days prior to admit. On 12/17 he passed swallow eval and was eating lunch when he suddenly became unresponsive. EMS was called and pt remained unresponsive with GSC 3, intubated in ER. He was taken to head CT and on returning from CT became suddenly responsive, appropriate, MAE.Initially had a PICC line with subsequent blood cultures growing coag-negative Staphylococcus aureus. The patient's PICC line was removed and left internal jugular central venous catheter was placed on 12/20. Repeat tracheostomy was performed on 12/20 at bedside by Dr. Tyson Alias with bronchoscopy to assist with placement. Patient was noted to have extensive mucus plugging involving the right lower lobe and bronchus intermedius. He was subsequently transferred to local LTAC but developed bleeding around the tracheostomy site that was unable to be controlled necessitating transfer  back to the intensive care unit for ongoing treatment. Patient is able to acknowledge she is having no difficulty breathing but is complaining about some neck as well as back pain.  PAST MEDICAL HISTORY :  Past Medical History  Diagnosis Date  . Guillain Barr syndrome (HCC)   . Anemia   . Hypertension   . Coronary artery disease   . Diabetes mellitus without complication (HCC)   . Dysphagia     PAST SURGICAL HISTORY: Past Surgical History  Procedure Laterality Date  . Tracheostomy       Allergies  Allergen Reactions  . Cephalosporins   . Eggs Or Egg-Derived Products Diarrhea    Per [facility] records  . Gluten Meal Diarrhea    Per [facility] records  . Keflex [Cephalexin] Hives    Per [facility] records  . Milk-Related Compounds Diarrhea    Per [facility] records  . Monosodium Glutamate Diarrhea    Per [facility] records  . Penicillins Other (See Comments)    Listed as an allergy by [facility] and [facility] records but no reaction given  . Latex Rash    Per [facility] records    No current facility-administered medications on file prior to encounter.   Current Outpatient Prescriptions on File Prior to Encounter  Medication Sig  . acetaminophen (TYLENOL) 325 MG tablet Take 650 mg by mouth every 6 (six) hours as needed (pain).  Marland Kitchen acetylcysteine (MUCOMYST) 20 % nebulizer solution Take 4 mLs by nebulization 2 (two) times daily.  Marland Kitchen albuterol (PROVENTIL) (2.5 MG/3ML) 0.083% nebulizer solution Take 3 mLs (2.5 mg total) by nebulization every  2 (two) hours as needed for wheezing.  Marland Kitchen amLODipine (NORVASC) 10 MG tablet Take 10 mg by mouth daily.  Marland Kitchen atorvastatin (LIPITOR) 40 MG tablet Take 40 mg by mouth at bedtime.  . carbamazepine (TEGRETOL) 200 MG tablet Take 200 mg by mouth 3 (three) times daily.  . cholecalciferol (VITAMIN D) 1000 UNITS tablet Take 2,000 Units by mouth daily.  . clobetasol ointment (TEMOVATE) 0.05 % Apply 1 application topically 2  (two) times daily.  Marland Kitchen dextrose 5 % solution Inject into the vein See admin instructions. Use 1 liter intravenously every shift for hydration at 50 ml/hr continuous  . enoxaparin (LOVENOX) 40 MG/0.4ML injection Inject 40 mg into the skin daily.  Marland Kitchen escitalopram (LEXAPRO) 20 MG tablet Take 20 mg by mouth at bedtime.  . fentaNYL (SUBLIMAZE) 100 MCG/2ML injection Inject 0.5-1 mLs (25-50 mcg total) into the vein every 2 (two) hours as needed for severe pain.  Marland Kitchen insulin aspart (NOVOLOG) 100 UNIT/ML injection Inject 0-15 Units into the skin every 4 (four) hours.  . insulin glargine (LANTUS) 100 UNIT/ML injection Inject 0.05 mLs (5 Units total) into the skin at bedtime.  . lamoTRIgine (LAMICTAL) 150 MG tablet Take 150 mg by mouth daily.  Marland Kitchen levofloxacin (LEVAQUIN) 750 MG/150ML SOLN Inject 150 mLs (750 mg total) into the vein daily.  . metoprolol tartrate (LOPRESSOR) 25 MG tablet Take 12.5 mg by mouth 2 (two) times daily.  . nortriptyline (PAMELOR) 50 MG capsule Take 50 mg by mouth at bedtime.  . Nutritional Supplements (FEEDING SUPPLEMENT, VITAL AF 1.2 CAL,) LIQD Place 1,000 mLs into feeding tube continuous.  Marland Kitchen omeprazole (PRILOSEC) 20 MG capsule Take 20 mg by mouth daily.  . ondansetron (ZOFRAN) 4 MG tablet Take 4 mg by mouth every 6 (six) hours as needed for nausea or vomiting.  . polyethylene glycol (MIRALAX / GLYCOLAX) packet Take 17 g by mouth every 12 (twelve) hours as needed (constipation).  . pregabalin (LYRICA) 25 MG capsule Take 25 mg by mouth 3 (three) times daily.  . primidone (MYSOLINE) 50 MG tablet Take 50 mg by mouth 3 (three) times daily. For bipolar  . sennosides (SENOKOT) 8.8 MG/5ML syrup Place 5 mLs into feeding tube 2 (two) times daily as needed for mild constipation.  . traMADol (ULTRAM) 50 MG tablet Take 50 mg by mouth every 6 (six) hours as needed (pain).  . traZODone (DESYREL) 50 MG tablet Take 50 mg by mouth at bedtime.  . vancomycin 1,250 mg in sodium chloride 0.9 % 250 mL Inject  1,250 mg into the vein every 8 (eight) hours.  . vitamin B-12 (CYANOCOBALAMIN) 1000 MCG tablet Take 1,000 mcg by mouth daily.    FAMILY HISTORY:  Unable to obtain given tracheostomy & ventilator requirement.  SOCIAL HISTORY: Social History  Substance Use Topics  . Smoking status: Unknown If Ever Smoked  . Smokeless tobacco: Not on file  . Alcohol Use: Not on file    REVIEW OF SYSTEMS:  Unable to obtain given tracheostomy & ventilator requirement.  SUBJECTIVE:   VITAL SIGNS: BP 136/81 mmHg  Pulse 95  Temp(Src) 98.7 F (37.1 C) (Oral)  Resp 29  Ht 5\' 11"  (1.803 m)  Wt 190 lb 0.6 oz (86.2 kg)  BMI 26.52 kg/m2  SpO2 100%  HEMODYNAMICS:    VENTILATOR SETTINGS: Vent Mode:  [-] PRVC FiO2 (%):  [40 %] 40 % Set Rate:  [18 bmp] 18 bmp Vt Set:  [660 mL] 660 mL PEEP:  [5 cmH20-8 cmH20] 5 cmH20 Plateau Pressure:  [  20 cmH20-25 cmH20] 23 cmH20  INTAKE / OUTPUT:    PHYSICAL EXAMINATION: General:  Awake. Alert. No acute distress. On ventilator.  Integument:  Warm & dry. No rash on exposed skin.  Lymphatics:  No appreciated cervical or supraclavicular lymphadenoapthy given presence of clot. HEENT:  No scleral injection or icterus. NGT in place as well as L IJ CVL. Significant clot around tracheostomy. Cardiovascular:  Regular rate. Normal S1 & S2. Sinus rhythm on telemetry. Pulmonary:  Good aeration bilaterally. Symmetric chest wall rise on ventilator. Abdomen: Soft. Normal bowel sounds. Nondistended.  Musculoskeletal:  No joint deformity or effusion appreciated. Neurological:  CN 2-12 grossly in tact. Minimal movement of bilateral upper extremities. Nods to questions. Psychiatric:  Unable to assess given tracheostomy & ventilator dependence.   LABS:  BMET  Recent Labs Lab 06/08/15 0315 06/09/15 0420 06/10/15 0510  NA 132* 134* 137  K 3.3* 3.1* 3.2*  CL 96* 98* 100*  CO2 BUN CREATININE 0.31* <0.30* <0.30*  GLUCOSE 173* 177* 170*     Electrolytes  Recent Labs Lab 06/08/15 0315  06/09/15 0420 06/09/15 1030 06/09/15 2104 06/10/15 0510 06/10/15 0904  CALCIUM 9.9  --  9.6  --   --  9.5  --   MG 1.5*  < > 1.7 1.8 1.6*  --  1.7  PHOS 2.3*  < > 2.0* 5.0* 2.4*  --  2.7  < > = values in this interval not displayed.  CBC  Recent Labs Lab 06/09/15 0420 06/09/15 1030 06/11/15  WBC 12.2* 10.8* 8.9  HGB 9.7* 9.8* 9.1*  HCT 30.8* 30.8* 27.8*  PLT 288 280 298    Coag's  Recent Labs Lab 06/09/15 1030  APTT 44*  INR 1.16    Sepsis Markers  Recent Labs Lab 06/07/15 1330  LATICACIDVEN 2.10*    ABG  Recent Labs Lab 06/07/15 1400 06/08/15 0430 06/10/15 2355  PHART 7.354 7.463* 7.580*  PCO2ART 54.4* 34.7* 28.5*  PO2ART 198.0* 89.0 74.8*    Liver Enzymes  Recent Labs Lab 06/07/15 1302  AST 99*  ALT 79*  ALKPHOS 235*  BILITOT 0.3  ALBUMIN 2.6*    Cardiac Enzymes  Recent Labs Lab 06/07/15 1302  TROPONINI 0.04*    Glucose  Recent Labs Lab 06/09/15 2024 06/09/15 2356 06/10/15 0317 06/10/15 0754 06/10/15 1126 06/10/15 2325  GLUCAP 180* 162* 153* 174* 111* 133*    Imaging Dg Chest Port 1 View  06/10/2015  CLINICAL DATA:  Central line placement EXAM: PORTABLE CHEST 1 VIEW COMPARISON:  Prior film same day FINDINGS: Cardiomediastinal silhouette is stable. Streaky perihilar and infrahilar interstitial infiltrates again noted. Stable tracheostomy tube position and right arm PICC line position. There is new left IJ central line with tip in SVC. No pneumothorax. IMPRESSION: Streaky perihilar and infrahilar interstitial infiltrates again noted. Stable tracheostomy tube position and right arm PICC line position. There is new left IJ central line with tip in SVC. No pneumothorax. Electronically Signed   By: Natasha Mead M.D.   On: 06/10/2015 10:55   Dg Chest Port 1 View  06/10/2015  CLINICAL DATA:  Tracheostomy placement EXAM: PORTABLE CHEST 1 VIEW COMPARISON:  06/10/2015 FINDINGS:  Tracheostomy tube in place with tip 5.2 cm above the carina. No pneumothorax. Stable right arm PICC line position with tip in SVC. Persistent bilateral patchy infiltrates left perihilar and right infrahilar region. No convincing pulmonary edema. Endotracheal has been removed. IMPRESSION: Tracheostomy tube is in place. No pneumothorax. Stable  right arm PICC line position. Persistent bilateral patchy infiltrates. Electronically Signed   By: Natasha Mead M.D.   On: 06/10/2015 10:10   Dg Chest Port 1 View  06/10/2015  CLINICAL DATA:  Intubated patient, acute respiratory failure, Guillain-Barre syndrome. EXAM: PORTABLE CHEST 1 VIEW COMPARISON:  PA and lateral chest x-ray of June 08, 2015 FINDINGS: The lungs are well-expanded. Confluent alveolar opacity persists lateral to the left heart border. Increased interstitial infiltrate elsewhere in the left lung and in the right lung has decreased in conspicuity. The heart and pulmonary vascularity are normal. There is no pleural effusion or pneumothorax. The endotracheal tube tip lies 3.8 cm above the carina. The right-sided PICC line tip projects over the midportion of the SVC. The esophagogastric tube tip projects below the inferior margin of the image. IMPRESSION: Slight interval improvement in bilateral interstitial infiltrates. Persistent left perihilar subsegmental atelectasis or alveolar pneumonia. Electronically Signed   By: David  [place] M.D.   On: 06/10/2015 07:32   Dg Abd Portable 1v  06/10/2015  CLINICAL DATA:  Feeding catheter placement EXAM: PORTABLE ABDOMEN - 1 VIEW COMPARISON:  06/07/15 FINDINGS: Feeding catheter is noted and appears to extend into the proximal jejunum. Scattered large and small bowel gas is noted. A tracheostomy and central venous line are noted as well. Postsurgical changes in the right upper quadrant are noted. IMPRESSION: Feeding catheter which appears to lie in the proximal jejunum Electronically Signed   By: Alcide Clever M.D.    On: 06/10/2015 15:52    ASSESSMENT / PLAN:  HEMATOLOGIC A:   Bleeding Around CVL Coagulopathy - Possibly secondary to poor renal clearance of Lovenox & ASA.  P:  Monitor Hgb daily with CBC SCDs Holding ASA & Chemical DVT Prophylaxis Checking serum fibrinogen Repeat Coags AM 12/22  INFECTIOUS A:   Coag Neg Staph Bacteremia - From PICC line 12/17 HCAP - Right sided mucus plugging noted on bronchoscopy 12/20  P:   Repeat Blood Cultures x2 Checking TTE Continuing Vancomycin & Levaquin Day #5 Plan for RUE PICC Line removal once coags normalize  PULMONARY A: Acute on Chronic Hypoxic Respiratory Failure Bleeding Around Tracheostomy HCAP Respiratory Alkalosis  P:   Full Vent Support Albuterol neb prn  CARDIOVASCULAR A:  H/O CAD H/O HTN  P:  Monitor on telemetry Holding ASA Continuing Lipitor, Lopressor, & Norvasc.  RENAL A:   Hypokalemia   P:   Monitor UOP with condom catheter Trending renal function daily with BUN/Creatinine Monitoring electrolytes daily  GASTROINTESTINAL A:   Dysphagia  P:   NPO Consider restarting tube feedings in the morning Needs PEG tube  ENDOCRINE A:   DM  P:   Accu-Checks q4hr Low dose SSI per algorithm Holding Lantus for now  NEUROLOGIC A:   H/O Guillian Barre H/O Bipolar D/O Prior Acute Encephalopathy - Resolved.  P:   RASS goal: 0 Fentanyl IV prn pain Continuing Lyrica, Primidone, Trazodone, Lamictal, Lexapro, Tegretol, & Pamelor.   FAMILY  - Updates: No family at bedside to update.  - Inter-disciplinary family meet or Palliative Care meeting due by:  12/28  TODAY'S SUMMARY:  59 year old male re-admitted with bleeding around insertion site for left central venous catheter. No evidence of bleeding around tracheostomy. Patient has an elevated PTT possibly secondary to poor renal clearance of Lovenox. Continuing to hold aspirin & chemical DVT prophylaxis.  I have spent a total of 39 minutes of critical  care time reviewing the patient's electronic medical record & caring for the  patient outside of procedure time.  Donna ChristenJennings E. Jamison NeighborNestor, M.D. [facility] Pager:  [phone] After 3pm or if no response, call [phone] 06/11/2015, 12:34 AM

## 2015-06-11 NOTE — Progress Notes (Signed)
Previous dressing removed from bleeding Left Internal Jugular central line, bleeding has stopped. Site cleaned with chloraprep x's 3. New CHG disk applied as well as new dressing. No active bleeding noted. Dr Tyson AliasFeinstein aware. Order placed to remove existing PICC line. IV team at bedside to complete. Nursing to continue to monitor pt.

## 2015-06-11 NOTE — Discharge Summary (Signed)
Physician Discharge Summary     -  Patient ID: Curtis Burgess MRN: [id number] DOB/AGE: Jul 23, 1955 59 y.o.  Admit date: 06/10/2015 Discharge date: 06/11/2015  Discharge Diagnoses:   Acute on chronic respiratory failure  HCAP Possible aspiration Atelectasis w/ right sided Collapse  Hx HTN  Hx CAD  Hyponatremia- resolved HypoK Hypomag Hypophos Dysphagia  Leukocytosis  Normocytic Anemia HCAP Atelectasis  Acute encephalopathy (resolved)  Hx guillain barre  Hx bipolar    Detailed Hospital Course:   59yo male with hx HTN, CAD, DM recent prolonged illness r/t guillain Barre with prolonged vent course and trach placement at [employer]. He was tx to [facility], ultimately liberated from vent and was recently decannulated 2 days prior to admit. On 12/17 he passed swallow eval and was eating lunch when he suddenly became unresponsive. EMS was called and pt remained unresponsive with GSC 3, intubated in ER. He was taken to head CT and on returning from CT became suddenly responsive, appropriate, MAE. Has ongoing generalized weakness r/t GB. PCCM called to admit.  Pt c/o back and L leg pain. Denies any memory of earlier events. Denies SOB, chest pain, cough, hemoptysis, fevers, headache, lightheadedness, dysphagia today.  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 He was supported on mechanical ventilation, empiric antibiotics were initiated and cultures were sent. His blood cultures from his PICC line grew coag negative SA. Left IJ CVL was placed.  The patient was stabilized and then later it was decided that the best course would be to go-ahead and re-do trach. This was completed on 12/20. During this event he had bronchoscopy to assist w/ tracheostomy placement, as well as to evaluate the airway. It was noted  during this evaluation he had extensive mucous plugging involving the RLL and right bronchus intermideus. He was lavaged and mucous was cleared. Later that morning, it was felt that he was stable for transfer back to the LTAC setting where he can resume rehabilitation efforts.  Upon return to [facility], he had moderate bleeding from left IJ CVL site.  He was therefore brought back to [facility] for further evaluation.  Site was examined, cleaned, and re-dressed with surgicel and sterile dressing.  Bleeding was controlled afterwards and remained controlled morning of 06/11/15.  Given that bleeding has terminated, he is now being discharged to [facility] in stable condition.   Discharge Plan by active problems   Acute on chronic respiratory failure in setting of HCAP vs aspiration w/ resultant atx/ Collapse  tracheostomy dependence  Plan:  Cont rehab efforts Cont PT efforts  pcxr now Wean per protocol  Continue vanc and Levaquin (start date: 12/17) -->complete 8-10d rx   Hx HTN  Hx CAD  Plan:  Continue norvasc, metoprolol  Hold ASA for now given recent bleeding  Dysphagia  Plan:  Cont TF  Needs PEG  Leukocytosis  Normocytic Anemia Plan:  CBC in AM Hold chemical VTE prophylaxis for now given recent bleeding  HCAP, concern aspiration, collapse lung BI on bronch with trach P:  F/u Sputum culture Continue vanc, levo Blood cx>> GPC (Right PICC line removed 12/21). Follow new Bronch bal  DM  Plan:  SSI, low dose lantus   Residual neuromuscular weakness Hx guillain barre  Hx bipolar  Plan:  Continue home bipolar meds  Cont rehab efforts     Significant Hospital tests/ studies  STUDIES:  CT head 12/17>>> neg acute   CULTURES: Sputum 12/17>>> BCx2 12/17: coag neg SA (from PICC) Bal bronch 12/29>>  ANTIBIOTICS: vanc 12/17>>>  Aztreonam 12/17>>>12/19 Levaquin 12/17>>>  SIGNIFICANT EVENTS: 12/19- no pressors 12/20- trach  planned, collapse on bronch rt  LINES/TUBES: ETT 12/17>>>12/20 Trach (feinstein #6 cuffed) 12/20>>> RUE PICC (pta) >>> Left IJ 12/20>>>  Discharge Exam: BP 140/90 mmHg  Pulse 100  Temp(Src) 98.7 F (37.1 C) (Oral)  Resp 26  Ht  (1.803 m)  Wt 86.2 kg (190 lb 0.6 oz)  BMI 26.52 kg/m2  SpO2 100%  General: Chronically ill appearing adult male, NAD on vent  Neuro: Awake, alert, very appropriate, attempting to talk HEENT: ETT in place, trach stoma essentially healed, no drainage.  Left IJ CVL site without bleeding, new dressing in place. CV: RRR, no m/g/r Lungs: rhonchi bilaterally Abdomen: BS+, soft, non-tender  Musculoskeletal: Warm and dry, trace BLE edema  Labs at discharge Lab Results  Component Value Date   CREATININE <0.30* 06/11/2015   BUN 13 06/11/2015   NA 136 06/11/2015   K 3.4* 06/11/2015   CL 103 06/11/2015   CO2 26 06/11/2015   Lab Results  Component Value Date   WBC 8.9 06/11/2015   HGB 9.1* 06/11/2015   HCT 27.8* 06/11/2015   MCV 84.5 06/11/2015   PLT 298 06/11/2015   Lab Results  Component Value Date   ALT 79* 06/07/2015   AST 99* 06/07/2015   ALKPHOS 235* 06/07/2015   BILITOT 0.3 06/07/2015   Lab Results  Component Value Date   INR 1.09 06/11/2015   INR 1.16 06/09/2015    Current radiology studies Dg Chest Port 1 View  06/10/2015  CLINICAL DATA:  Central line placement EXAM: PORTABLE CHEST 1 VIEW COMPARISON:  Prior film same day FINDINGS: Cardiomediastinal silhouette is stable. Streaky perihilar and infrahilar interstitial infiltrates again noted. Stable tracheostomy tube position and right arm PICC line position. There is new left IJ central line with tip in SVC. No pneumothorax. IMPRESSION: Streaky perihilar and infrahilar interstitial infiltrates again noted. Stable tracheostomy tube position and right arm PICC line position. There is new left IJ central line with tip in SVC. No pneumothorax. Electronically Signed   By: Natasha Mead  M.D.   On: 06/10/2015 10:55   Dg Chest Port 1 View  06/10/2015  CLINICAL DATA:  Tracheostomy placement EXAM: PORTABLE CHEST 1 VIEW COMPARISON:  06/10/2015 FINDINGS: Tracheostomy tube in place with tip 5.2 cm above the carina. No pneumothorax. Stable right arm PICC line position with tip in SVC. Persistent bilateral patchy infiltrates left perihilar and right infrahilar region. No convincing pulmonary edema. Endotracheal has been removed. IMPRESSION: Tracheostomy tube is in place. No pneumothorax. Stable right arm PICC line position. Persistent bilateral patchy infiltrates. Electronically Signed   By: Natasha Mead M.D.   On: 06/10/2015 10:10   Dg Chest Port 1 View  06/10/2015  CLINICAL DATA:  Intubated patient, acute respiratory failure, Guillain-Barre syndrome. EXAM: PORTABLE CHEST 1 VIEW COMPARISON:  PA and lateral chest x-ray of June 08, 2015 FINDINGS: The lungs are well-expanded. Confluent alveolar opacity persists lateral to the left heart border. Increased interstitial infiltrate elsewhere in the left lung and in the right lung has decreased in conspicuity. The heart and pulmonary vascularity are normal. There is no pleural effusion or pneumothorax. The endotracheal tube tip lies 3.8 cm above the carina. The right-sided PICC line tip projects over the midportion of the SVC. The esophagogastric tube tip projects below the inferior  margin of the image. IMPRESSION: Slight interval improvement in bilateral interstitial infiltrates. Persistent left perihilar subsegmental atelectasis or alveolar pneumonia. Electronically Signed   By: David  Swazil[place] M.D.   On: 06/10/2015 07:32   Dg Abd Portable 1v  06/10/2015  CLINICAL DATA:  Feeding catheter placement EXAM: PORTABLE ABDOMEN - 1 VIEW COMPARISON:  06/07/15 FINDINGS: Feeding catheter is noted and appears to extend into the proximal jejunum. Scattered large and small bowel gas is noted. A tracheostomy and central venous line are noted as well. Postsurgical  changes in the right upper quadrant are noted. IMPRESSION: Feeding catheter which appears to lie in the proximal jejunum Electronically Signed   By: Alcide CleverMark  Lukens M.D.   On: 06/10/2015 15:52    Disposition:  63-Long Term Care     Medication List    STOP taking these medications        enoxaparin 40 MG/0.4ML injection  Commonly known as:  LOVENOX      TAKE these medications        acetaminophen 325 MG tablet  Commonly known as:  TYLENOL  Take 650 mg by mouth every 6 (six) hours as needed (pain).     acetylcysteine 20 % nebulizer solution  Commonly known as:  MUCOMYST  Take 4 mLs by nebulization 2 (two) times daily.     albuterol (2.5 MG/3ML) 0.083% nebulizer solution  Commonly known as:  PROVENTIL  Take 3 mLs (2.5 mg total) by nebulization every 2 (two) hours as needed for wheezing.     amLODipine 10 MG tablet  Commonly known as:  NORVASC  Take 10 mg by mouth daily.     atorvastatin 40 MG tablet  Commonly known as:  LIPITOR  Take 40 mg by mouth at bedtime.     carbamazepine 200 MG tablet  Commonly known as:  TEGRETOL  Take 200 mg by mouth 3 (three) times daily.     cholecalciferol 1000 UNITS tablet  Commonly known as:  VITAMIN D  Take 2,000 Units by mouth daily.     clobetasol ointment 0.05 %  Commonly known as:  TEMOVATE  Apply 1 application topically 2 (two) times daily.     dextrose 5 % solution  Inject into the vein See admin instructions. Use 1 liter intravenously every shift for hydration at 50 ml/hr continuous     escitalopram 20 MG tablet  Commonly known as:  LEXAPRO  Take 20 mg by mouth at bedtime.     feeding supplement (VITAL AF 1.2 CAL) Liqd  Place 1,000 mLs into feeding tube continuous.     fentaNYL 100 MCG/2ML injection  Commonly known as:  SUBLIMAZE  Inject 0.5-1 mLs (25-50 mcg total) into the vein every 2 (two) hours as needed for severe pain.     insulin aspart 100 UNIT/ML injection  Commonly known as:  novoLOG  Inject 0-15 Units into  the skin every 4 (four) hours.     insulin glargine 100 UNIT/ML injection  Commonly known as:  LANTUS  Inject 0.05 mLs (5 Units total) into the skin at bedtime.     lamoTRIgine 150 MG tablet  Commonly known as:  LAMICTAL  Take 150 mg by mouth daily.     levofloxacin 750 MG/150ML Soln  Commonly known as:  LEVAQUIN  Inject 150 mLs (750 mg total) into the vein daily.     metoprolol tartrate 25 MG tablet  Commonly known as:  LOPRESSOR  Take 12.5 mg by mouth 2 (two) times daily.  nortriptyline 50 MG capsule  Commonly known as:  PAMELOR  Take 50 mg by mouth at bedtime.     omeprazole 20 MG capsule  Commonly known as:  PRILOSEC  Take 20 mg by mouth daily.     ondansetron 4 MG tablet  Commonly known as:  ZOFRAN  Take 4 mg by mouth every 6 (six) hours as needed for nausea or vomiting.     polyethylene glycol packet  Commonly known as:  MIRALAX / GLYCOLAX  Take 17 g by mouth every 12 (twelve) hours as needed (constipation).     pregabalin 25 MG capsule  Commonly known as:  LYRICA  Take 25 mg by mouth 3 (three) times daily.     primidone 50 MG tablet  Commonly known as:  MYSOLINE  Take 50 mg by mouth 3 (three) times daily. For bipolar     sennosides 8.8 MG/5ML syrup  Commonly known as:  SENOKOT  Place 5 mLs into feeding tube 2 (two) times daily as needed for mild constipation.     THROMBI-PAD 3"X3" Pads  Apply 2 each topically once.     traMADol 50 MG tablet  Commonly known as:  ULTRAM  Take 50 mg by mouth every 6 (six) hours as needed (pain).     traZODone 50 MG tablet  Commonly known as:  DESYREL  Take 50 mg by mouth at bedtime.     vancomycin 1,250 mg in sodium chloride 0.9 % 250 mL  Inject 1,250 mg into the vein every 8 (eight) hours.     vitamin B-12 1000 MCG tablet  Commonly known as:  CYANOCOBALAMIN  Take 1,000 mcg by mouth daily.         Discharged Condition: Stable.  Rutherford Guys, PA - [facility] Pager: [phone]  or [phone] 06/11/2015, 10:59 AM    Physician Statement:   The Patient was personally examined, the discharge assessment and plan has been personally reviewed and I agree with assessment and plan as above. > 45 minutes of time have been dedicated to discharge assessment, planning and discharge instructions.

## 2015-06-11 NOTE — Progress Notes (Signed)
ANTIBIOTIC CONSULT NOTE - INITIAL  Pharmacy Consult for Vancomycin/Levaquin  Indication: rule out pneumonia  Allergies  Allergen Reactions  . Cephalosporins   . Eggs Or Egg-Derived Products Diarrhea    Per [facility] records  . Gluten Meal Diarrhea    Per [facility] records  . Keflex [Cephalexin] Hives    Per [facility] records  . Milk-Related Compounds Diarrhea    Per [facility] records  . Monosodium Glutamate Diarrhea    Per [facility] records  . Penicillins Other (See Comments)    Listed as an allergy by [facility] and [facility] records but no reaction given  . Latex Rash    Per [facility] records    Patient Measurements: Height:  (180.3 cm) Weight: 190 lb 0.6 oz (86.2 kg) IBW/kg (Calculated) : 75.3  Vital Signs: Temp: 98.7 F (37.1 C) (12/20 2327) Temp Source: Oral (12/20 2327) BP: 136/81 mmHg (12/20 2305) Pulse Rate: 95 (12/20 2305)  Labs:  Recent Labs  06/08/15 0315 06/09/15 0420 06/09/15 1030 06/10/15 0510 06/11/15  WBC 15.4* 12.2* 10.8*  --  8.9  HGB 11.9* 9.7* 9.8*  --  9.1*  PLT 312 288 280  --  298  CREATININE 0.31* <0.30*  --  <0.30*  --    CrCl cannot be calculated (Patient has no serum creatinine result on file.).  Recent Labs  06/09/15 1540  VANCOTROUGH 8*     Microbiology: Recent Results (from the past 720 hour(s))  Blood culture (routine x 2)     Status: None   Collection Time: 06/07/15  1:02 PM  Result Value Ref Range Status   Specimen Description BLOOD RIGHT ARM PICC LINE  Final   Special Requests BOTTLES DRAWN AEROBIC AND ANAEROBIC 5CC  Final   Culture  Setup Time   Final    GRAM POSITIVE COCCI IN CLUSTERS IN BOTH AEROBIC AND ANAEROBIC BOTTLES CRITICAL RESULT CALLED TO, READ BACK BY AND VERIFIED WITH: C FORCSHA 06/08/15 @ 0842 M VESTAL    Culture   Final    STAPHYLOCOCCUS SPECIES (COAGULASE NEGATIVE) THE SIGNIFICANCE OF ISOLATING THIS ORGANISM FROM A SINGLE SET OF BLOOD CULTURES WHEN MULTIPLE  SETS ARE DRAWN IS UNCERTAIN. PLEASE NOTIFY THE MICROBIOLOGY DEPARTMENT WITHIN ONE WEEK IF SPECIATION AND SENSITIVITIES ARE REQUIRED.    Report Status 06/10/2015 FINAL  Final  MRSA PCR Screening     Status: None   Collection Time: 06/07/15  5:36 PM  Result Value Ref Range Status   MRSA by PCR NEGATIVE NEGATIVE Final    Comment:        The GeneXpert MRSA Assay (FDA approved for NASAL specimens only), is one component of a comprehensive MRSA colonization surveillance program. It is not intended to diagnose MRSA infection nor to guide or monitor treatment for MRSA infections.   Blood culture (routine x 2)     Status: None (Preliminary result)   Collection Time: 06/07/15  7:00 PM  Result Value Ref Range Status   Specimen Description BLOOD LEFT HAND  Final   Special Requests IN PEDIATRIC BOTTLE 4CC  Final   Culture NO GROWTH 3 DAYS  Final   Report Status PENDING  Incomplete    Medical History: Past Medical History  Diagnosis Date  . Guillain Barr syndrome (HCC)   . Anemia   . Hypertension   . Coronary artery disease   . Diabetes mellitus without complication (HCC)   . Dysphagia      Assessment: 59 y/o M  with discharge yesterday back from [facility] with trach bleeding, WBC 8.9, renal function appears ok, other labs/meds reviewed. On broad spectrum anti-biotics for HCAP vs. Aspiration PNA.   Goal of Therapy:  Vancomycin trough level 15-20 mcg/ml  Plan:  -Vancomycin 1250 mg IV q8h (had a trough of 8 on 1250 mg IV q12h) -Levaquin 750 mg IV q24h -Trend WBC, temp, renal function  -Drug levels as steady state  Abran DukeLedford, Avyon Herendeen 06/11/2015,12:54 AM

## 2015-06-12 LAB — CBC WITH DIFFERENTIAL/PLATELET
BASOS ABS: 0 10*3/uL (ref 0.0–0.1)
Basophils Relative: 0 %
EOS ABS: 0.4 10*3/uL (ref 0.0–0.7)
EOS PCT: 3 %
HCT: 25.5 % — ABNORMAL LOW (ref 39.0–52.0)
Hemoglobin: 8.3 g/dL — ABNORMAL LOW (ref 13.0–17.0)
LYMPHS PCT: 24 %
Lymphs Abs: 2.7 10*3/uL (ref 0.7–4.0)
MCH: 27.6 pg (ref 26.0–34.0)
MCHC: 32.5 g/dL (ref 30.0–36.0)
MCV: 84.7 fL (ref 78.0–100.0)
MONO ABS: 0.9 10*3/uL (ref 0.1–1.0)
Monocytes Relative: 8 %
Neutro Abs: 7.2 10*3/uL (ref 1.7–7.7)
Neutrophils Relative %: 65 %
PLATELETS: 325 10*3/uL (ref 150–400)
RBC: 3.01 MIL/uL — ABNORMAL LOW (ref 4.22–5.81)
RDW: 14.5 % (ref 11.5–15.5)
WBC: 11.2 10*3/uL — AB (ref 4.0–10.5)

## 2015-06-12 LAB — CULTURE, RESPIRATORY W GRAM STAIN: Culture: NORMAL

## 2015-06-12 LAB — COMPREHENSIVE METABOLIC PANEL
ALT: 42 U/L (ref 17–63)
AST: 53 U/L — AB (ref 15–41)
Albumin: 1.7 g/dL — ABNORMAL LOW (ref 3.5–5.0)
Alkaline Phosphatase: 163 U/L — ABNORMAL HIGH (ref 38–126)
Anion gap: 7 (ref 5–15)
BUN: 12 mg/dL (ref 6–20)
CHLORIDE: 104 mmol/L (ref 101–111)
CO2: 28 mmol/L (ref 22–32)
Calcium: 9.1 mg/dL (ref 8.9–10.3)
Creatinine, Ser: <0.3 mg/dL — ABNORMAL LOW (ref 0.61–1.24)
Glucose, Bld: 180 mg/dL — ABNORMAL HIGH (ref 65–99)
POTASSIUM: 3.2 mmol/L — AB (ref 3.5–5.1)
SODIUM: 139 mmol/L (ref 135–145)
Total Bilirubin: 0.2 mg/dL — ABNORMAL LOW (ref 0.3–1.2)
Total Protein: 4.8 g/dL — ABNORMAL LOW (ref 6.5–8.1)

## 2015-06-12 LAB — CULTURE, BLOOD (ROUTINE X 2): CULTURE: NO GROWTH

## 2015-06-12 LAB — MAGNESIUM: MAGNESIUM: 1.6 mg/dL — AB (ref 1.7–2.4)

## 2015-06-12 LAB — CULTURE, RESPIRATORY

## 2015-06-12 LAB — PHOSPHORUS: PHOSPHORUS: 2.8 mg/dL (ref 2.5–4.6)

## 2015-06-13 LAB — BASIC METABOLIC PANEL
ANION GAP: 6 (ref 5–15)
BUN: 10 mg/dL (ref 6–20)
CALCIUM: 9.2 mg/dL (ref 8.9–10.3)
CO2: 28 mmol/L (ref 22–32)
Chloride: 102 mmol/L (ref 101–111)
Creatinine, Ser: <0.3 mg/dL — ABNORMAL LOW (ref 0.61–1.24)
GLUCOSE: 162 mg/dL — AB (ref 65–99)
POTASSIUM: 3.9 mmol/L (ref 3.5–5.1)
SODIUM: 136 mmol/L (ref 135–145)

## 2015-06-13 LAB — MAGNESIUM: MAGNESIUM: 1.9 mg/dL (ref 1.7–2.4)

## 2015-06-13 LAB — PTH, INTACT AND CALCIUM
CALCIUM TOTAL (PTH): 8.9 mg/dL (ref 8.7–10.2)
PTH: 43 pg/mL (ref 15–65)

## 2015-06-13 LAB — VANCOMYCIN, TROUGH: VANCOMYCIN TR: 11 ug/mL (ref 10.0–20.0)

## 2015-06-16 LAB — CULTURE, BLOOD (ROUTINE X 2)
Culture: NO GROWTH
Culture: NO GROWTH

## 2015-06-19 LAB — CULTURE, RESPIRATORY W GRAM STAIN

## 2015-06-19 LAB — CULTURE, RESPIRATORY

## 2015-06-21 LAB — CBC
HEMATOCRIT: 31.3 % — AB (ref 39.0–52.0)
HEMOGLOBIN: 9.9 g/dL — AB (ref 13.0–17.0)
MCH: 26.8 pg (ref 26.0–34.0)
MCHC: 31.6 g/dL (ref 30.0–36.0)
MCV: 84.6 fL (ref 78.0–100.0)
Platelets: 542 10*3/uL — ABNORMAL HIGH (ref 150–400)
RBC: 3.7 MIL/uL — AB (ref 4.22–5.81)
RDW: 15.7 % — ABNORMAL HIGH (ref 11.5–15.5)
WBC: 10.3 10*3/uL (ref 4.0–10.5)

## 2015-06-21 LAB — BASIC METABOLIC PANEL WITH GFR
Anion gap: 10 (ref 5–15)
BUN: 10 mg/dL (ref 6–20)
CO2: 29 mmol/L (ref 22–32)
Calcium: 10.1 mg/dL (ref 8.9–10.3)
Chloride: 95 mmol/L — ABNORMAL LOW (ref 101–111)
Creatinine, Ser: <0.3 mg/dL — ABNORMAL LOW (ref 0.61–1.24)
Glucose, Bld: 191 mg/dL — ABNORMAL HIGH (ref 65–99)
Potassium: 3.5 mmol/L (ref 3.5–5.1)
Sodium: 134 mmol/L — ABNORMAL LOW (ref 135–145)

## 2015-06-25 LAB — CBC
HCT: 28.1 % — ABNORMAL LOW (ref 39.0–52.0)
Hemoglobin: 9.3 g/dL — ABNORMAL LOW (ref 13.0–17.0)
MCH: 27.8 pg (ref 26.0–34.0)
MCHC: 33.1 g/dL (ref 30.0–36.0)
MCV: 83.9 fL (ref 78.0–100.0)
PLATELETS: 435 10*3/uL — AB (ref 150–400)
RBC: 3.35 MIL/uL — AB (ref 4.22–5.81)
RDW: 15.4 % (ref 11.5–15.5)
WBC: 14.4 10*3/uL — ABNORMAL HIGH (ref 4.0–10.5)

## 2015-06-25 LAB — PROTIME-INR
INR: 1.06 (ref 0.00–1.49)
PROTHROMBIN TIME: 14 s (ref 11.6–15.2)

## 2015-06-25 LAB — APTT: aPTT: 42 seconds — ABNORMAL HIGH (ref 24–37)

## 2015-06-26 ENCOUNTER — Other Ambulatory Visit (HOSPITAL_COMMUNITY): Payer: Self-pay

## 2015-06-26 ENCOUNTER — Encounter: Payer: Self-pay | Admitting: Radiology

## 2015-06-26 NOTE — Consult Note (Signed)
Chief Complaint: Patient was seen in consultation today for percutaneous gastric tube placement at the request of Dr Sharyon Medicus  Referring Physician(s): Hijazi  History of Present Illness: Curtis Burgess is a 60 y.o. male   Dementia Guillain Barre Syndrome Vent/trach Deconditioning Dysphagia Malnutrition Need for long term care Request for percutaneous gastric tube placement per Dr Sharyon Medicus Dr Lowella Dandy has reviewed imaging and approves procedure Will need barium to opacify bowel I have seen and examined pt  Past Medical History  Diagnosis Date  . Guillain Barr syndrome (HCC)   . Anemia   . Hypertension   . Coronary artery disease   . Diabetes mellitus without complication (HCC)   . Dysphagia     Past Surgical History  Procedure Laterality Date  . Tracheostomy      Allergies: Cephalosporins; Eggs or egg-derived products; Gluten meal; Keflex; Milk-related compounds; Monosodium glutamate; Penicillins; and Latex  Medications: Prior to Admission medications   Medication Sig Start Date End Date Taking? Authorizing Provider  acetaminophen (TYLENOL) 325 MG tablet Take 650 mg by mouth every 6 (six) hours as needed (pain).    Historical Provider, MD  acetylcysteine (MUCOMYST) 20 % nebulizer solution Take 4 mLs by nebulization 2 (two) times daily. 06/10/15   Rahul P Desai, PA-C  albuterol (PROVENTIL) (2.5 MG/3ML) 0.083% nebulizer solution Take 3 mLs (2.5 mg total) by nebulization every 2 (two) hours as needed for wheezing. 06/10/15   Rahul P Desai, PA-C  amLODipine (NORVASC) 10 MG tablet Take 10 mg by mouth daily.    Historical Provider, MD  atorvastatin (LIPITOR) 40 MG tablet Take 40 mg by mouth at bedtime.    Historical Provider, MD  carbamazepine (TEGRETOL) 200 MG tablet Take 200 mg by mouth 3 (three) times daily.    Historical Provider, MD  cholecalciferol (VITAMIN D) 1000 UNITS tablet Take 2,000 Units by mouth daily.    Historical Provider, MD  clobetasol ointment (TEMOVATE)  0.05 % Apply 1 application topically 2 (two) times daily.    Historical Provider, MD  dextrose 5 % solution Inject into the vein See admin instructions. Use 1 liter intravenously every shift for hydration at 50 ml/hr continuous    Historical Provider, MD  escitalopram (LEXAPRO) 20 MG tablet Take 20 mg by mouth at bedtime.    Historical Provider, MD  fentaNYL (SUBLIMAZE) 100 MCG/2ML injection Inject 0.5-1 mLs (25-50 mcg total) into the vein every 2 (two) hours as needed for severe pain. 06/10/15   Rahul P Desai, PA-C  insulin aspart (NOVOLOG) 100 UNIT/ML injection Inject 0-15 Units into the skin every 4 (four) hours. 06/10/15   Rahul P Desai, PA-C  insulin glargine (LANTUS) 100 UNIT/ML injection Inject 0.05 mLs (5 Units total) into the skin at bedtime. 06/10/15   Rahul P Desai, PA-C  lamoTRIgine (LAMICTAL) 150 MG tablet Take 150 mg by mouth daily.    Historical Provider, MD  levofloxacin (LEVAQUIN) 750 MG/150ML SOLN Inject 150 mLs (750 mg total) into the vein daily. 06/10/15   Rahul P Desai, PA-C  metoprolol tartrate (LOPRESSOR) 25 MG tablet Take 12.5 mg by mouth 2 (two) times daily.    Historical Provider, MD  nortriptyline (PAMELOR) 50 MG capsule Take 50 mg by mouth at bedtime.    Historical Provider, MD  Nutritional Supplements (FEEDING SUPPLEMENT, VITAL AF 1.2 CAL,) LIQD Place 1,000 mLs into feeding tube continuous. 06/10/15   Rahul P Desai, PA-C  omeprazole (PRILOSEC) 20 MG capsule Take 20 mg by mouth daily.    Historical Provider,  MD  ondansetron (ZOFRAN) 4 MG tablet Take 4 mg by mouth every 6 (six) hours as needed for nausea or vomiting.    Historical Provider, MD  polyethylene glycol (MIRALAX / GLYCOLAX) packet Take 17 g by mouth every 12 (twelve) hours as needed (constipation).    Historical Provider, MD  pregabalin (LYRICA) 25 MG capsule Take 25 mg by mouth 3 (three) times daily.    Historical Provider, MD  primidone (MYSOLINE) 50 MG tablet Take 50 mg by mouth 3 (three) times daily. For  bipolar    Historical Provider, MD  sennosides (SENOKOT) 8.8 MG/5ML syrup Place 5 mLs into feeding tube 2 (two) times daily as needed for mild constipation. 06/10/15   Rahul P Desai, PA-C  THROMBI-PAD [facility](THROMBI-PAD) [phone]"X3" PADS Apply 2 each topically once. 06/11/15   Rahul P Desai, PA-C  traMADol (ULTRAM) 50 MG tablet Take 50 mg by mouth every 6 (six) hours as needed (pain).    Historical Provider, MD  traZODone (DESYREL) 50 MG tablet Take 50 mg by mouth at bedtime.    Historical Provider, MD  vancomycin 1,250 mg in sodium chloride 0.9 % 250 mL Inject 1,250 mg into the vein every 8 (eight) hours. 06/10/15   Rahul P Desai, PA-C  vitamin B-12 (CYANOCOBALAMIN) 1000 MCG tablet Take 1,000 mcg by mouth daily.    Historical Provider, MD     No family history on file.  Social History   Social History  . Marital Status: Single    Spouse Name: N/A  . Number of Children: N/A  . Years of Education: N/A   Social History Main Topics  . Smoking status: Unknown If Ever Smoked  . Smokeless tobacco: None  . Alcohol Use: None  . Drug Use: None  . Sexual Activity: Not Asked   Other Topics Concern  . None   Social History Narrative     Review of Systems: A 12 point ROS discussed and pertinent positives are indicated in the HPI above.  All other systems are negative.  Review of Systems  Constitutional: Positive for activity change and appetite change. Negative for fever.  Respiratory: Negative for shortness of breath and wheezing.   Neurological: Positive for weakness.  Psychiatric/Behavioral: Positive for confusion.    Vital Signs: There were no vitals taken for this visit.  Physical Exam  Cardiovascular: Normal rate and regular rhythm.   Pulmonary/Chest: Effort normal. He has wheezes.  Abdominal: Soft. Bowel sounds are normal. There is no tenderness.  Musculoskeletal:  No real response Does follow me with eyes  Skin: Skin is warm.  Psychiatric:  Consented son Curtis Burgess via phone    Nursing note and vitals reviewed.   Mallampati Score:  MD Evaluation Airway: Other (comments) Airway comments: trach/vent Heart: WNL Abdomen: WNL Chest/ Lungs: WNL ASA  Classification: 3 Mallampati/Airway Score: Three  Imaging: Ct Abdomen Wo Contrast  06/26/2015  CLINICAL DATA:  Respiratory failure and prolonged illness after Guillain Barre syndrome. The patient requires long-term nutrition and assessment is performed for possible percutaneous gastrostomy tube placement. Currently a nasoenteric long GI tube is in place. EXAM: CT ABDOMEN WITHOUT CONTRAST TECHNIQUE: Multidetector CT imaging of the abdomen was performed following the standard protocol without IV contrast. COMPARISON:  Abdominal film on 06/11/2015 FINDINGS: Bilateral lung bases show patchy consolidative airspace disease consistent with pneumonia in both lower lobes, right greater than left. Enteric feeding tube is present which traverses the duodenum with the tip in the proximal jejunum. The stomach is normal in position. However, there  is a significant colonic ileus with gaseous distention of the colon and superimposition of the transverse colon anterior to the stomach. Current anatomy is prohibitive to safe gastrostomy tube placement. No small bowel dilatation is identified. No free air, free fluid or abscess identified. Unenhanced appearance of the liver, spleen, pancreas, adrenal glands and kidneys are unremarkable. No hernias are identified. Bony structures are unremarkable. IMPRESSION: 1. Anatomy currently prohibitive to safe gastrostomy tube placement due to significant colonic ileus with distended transverse colon anterior to the stomach. Ileus wound need to improve prior to considering percutaneous gastrostomy tube placement. 2. The current enteric tube is well positioned with the tip in the proximal jejunum. 3. Bilateral lower lobe pneumonia visualized at the lung bases, right greater than left. Electronically Signed   By:  Irish Lack M.D.   On: 06/26/2015 08:04   Ct Head Wo Contrast  06/07/2015  CLINICAL DATA:  Unresponsive patient. EXAM: CT HEAD WITHOUT CONTRAST TECHNIQUE: Contiguous axial images were obtained from the base of the skull through the vertex without intravenous contrast. COMPARISON:  None. FINDINGS: Ventricles and sulci are prominent compatible with atrophy. Periventricular and subcortical white matter hypodensity compatible with chronic small vessel ischemic changes. No evidence for acute cortically based infarct, intracranial hemorrhage, mass lesion or mass-effect. Orbits are unremarkable. Paranasal sinuses are well aerated. Mastoid air cells are unremarkable. Calvarium is intact. IMPRESSION: No acute intracranial process. Chronic small vessel ischemic changes. Electronically Signed   By: Annia Belt M.D.   On: 06/07/2015 14:03   Dg Chest Port 1 View  06/11/2015  CLINICAL DATA:  Pneumonia EXAM: PORTABLE CHEST 1 VIEW COMPARISON:  06/10/2015 FINDINGS: Tracheostomy tube is unchanged in position. Left IJ central line is stable in position. NG tube in place. No pneumothorax. Streaky bilateral infrahilar basilar interstitial infiltrates again noted. No pulmonary edema. Slight improvement in aeration from prior exam. Right PICC line has been removed. IMPRESSION: Stable support apparatus.No pneumothorax. Streaky bilateral infrahilar basilar interstitial infiltrates again noted. No pulmonary edema. Electronically Signed   By: Natasha Mead M.D.   On: 06/11/2015 20:17   Dg Chest Port 1 View  06/10/2015  CLINICAL DATA:  Central line placement EXAM: PORTABLE CHEST 1 VIEW COMPARISON:  Prior film same day FINDINGS: Cardiomediastinal silhouette is stable. Streaky perihilar and infrahilar interstitial infiltrates again noted. Stable tracheostomy tube position and right arm PICC line position. There is new left IJ central line with tip in SVC. No pneumothorax. IMPRESSION: Streaky perihilar and infrahilar interstitial  infiltrates again noted. Stable tracheostomy tube position and right arm PICC line position. There is new left IJ central line with tip in SVC. No pneumothorax. Electronically Signed   By: Natasha Mead M.D.   On: 06/10/2015 10:55   Dg Chest Port 1 View  06/10/2015  CLINICAL DATA:  Tracheostomy placement EXAM: PORTABLE CHEST 1 VIEW COMPARISON:  06/10/2015 FINDINGS: Tracheostomy tube in place with tip 5.2 cm above the carina. No pneumothorax. Stable right arm PICC line position with tip in SVC. Persistent bilateral patchy infiltrates left perihilar and right infrahilar region. No convincing pulmonary edema. Endotracheal has been removed. IMPRESSION: Tracheostomy tube is in place. No pneumothorax. Stable right arm PICC line position. Persistent bilateral patchy infiltrates. Electronically Signed   By: Natasha Mead M.D.   On: 06/10/2015 10:10   Dg Chest Port 1 View  06/10/2015  CLINICAL DATA:  Intubated patient, acute respiratory failure, Guillain-Barre syndrome. EXAM: PORTABLE CHEST 1 VIEW COMPARISON:  PA and lateral chest x-ray of June 08, 2015 FINDINGS:  The lungs are well-expanded. Confluent alveolar opacity persists lateral to the left heart border. Increased interstitial infiltrate elsewhere in the left lung and in the right lung has decreased in conspicuity. The heart and pulmonary vascularity are normal. There is no pleural effusion or pneumothorax. The endotracheal tube tip lies 3.8 cm above the carina. The right-sided PICC line tip projects over the midportion of the SVC. The esophagogastric tube tip projects below the inferior margin of the image. IMPRESSION: Slight interval improvement in bilateral interstitial infiltrates. Persistent left perihilar subsegmental atelectasis or alveolar pneumonia. Electronically Signed   By: David  [place] M.D.   On: 06/10/2015 07:32   Dg Chest Port 1 View  06/08/2015  CLINICAL DATA:  60 year old male with pneumonia. EXAM: PORTABLE CHEST 1 VIEW COMPARISON:   06/07/2015 FINDINGS: The cardiomediastinal silhouette is unchanged. An endotracheal tube with tip 2.7 cm above the carina and right PICC line with tip overlying the lower SVC again noted. Decreased left mid and lower lung airspace disease noted. Decreased right basilar opacity/atelectasis noted. There is no evidence of pneumothorax or pleural effusion. IMPRESSION: Improved left mid-lower lung airspace disease and right basilar atelectasis/ airspace disease. Electronically Signed   By: Harmon Pier M.D.   On: 06/08/2015 08:56   Dg Chest Portable 1 View  06/07/2015  CLINICAL DATA:  Patient is unresponsive. Evaluate endotracheal tube placement. EXAM: PORTABLE CHEST 1 VIEW COMPARISON:  None. FINDINGS: ET tube terminates in the distal trachea, 1 cm superior to the carina. Right upper extremity PICC line tip projects over the superior vena cava. Stable cardiac and mediastinal contours. Left mid and lower lung heterogeneous pulmonary opacities. Heterogeneous opacities right lung base. Probable small left pleural effusion. IMPRESSION: Left mid and lower lung and right lung base heterogeneous opacities concerning for multi focal infection or aspiration in the appropriate clinical setting. ET tube terminates in the distal trachea.  Recommend retraction. Continued radiographic follow-up is recommended to ensure resolution of pulmonary findings. Electronically Signed   By: Annia Belt M.D.   On: 06/07/2015 13:59   Dg Abd Portable 1v  06/11/2015  CLINICAL DATA:  60 year old male with enteric tube placement. Evaluate for positioning. Radiograph dated 06/10/2015 EXAM: PORTABLE ABDOMEN - 1 VIEW COMPARISON:  Radiograph dated 06/10/2015 FINDINGS: An enteric tube is partially visualized over the epigastric area with tip projecting in the left upper abdomen in similar positioning as with prior study. : IMPRESSION: No interval change in the positioning of the enteric tube. Electronically Signed   By: Elgie Collard M.D.   On:  06/11/2015 21:19   Dg Abd Portable 1v  06/10/2015  CLINICAL DATA:  Feeding catheter placement EXAM: PORTABLE ABDOMEN - 1 VIEW COMPARISON:  06/07/15 FINDINGS: Feeding catheter is noted and appears to extend into the proximal jejunum. Scattered large and small bowel gas is noted. A tracheostomy and central venous line are noted as well. Postsurgical changes in the right upper quadrant are noted. IMPRESSION: Feeding catheter which appears to lie in the proximal jejunum Electronically Signed   By: Alcide Clever M.D.   On: 06/10/2015 15:52   Dg Abd Portable 1v  06/07/2015  CLINICAL DATA:  Evaluate OG tube placement EXAM: PORTABLE ABDOMEN - 1 VIEW COMPARISON:  None. FINDINGS: The orogastric tube tip is just below the GE junction and should be advanced. Right renal calculi noted. There is gaseous distension of the colon. IMPRESSION: Tip of OG tube is just below the GE junction and should be further advanced into the gastric lumen. Electronically Signed  By: Signa Kell M.D.   On: 06/07/2015 16:36    Labs:  CBC:  Recent Labs  06/11/15 06/12/15 0823 06/21/15 0812 06/25/15 1826  WBC 8.9 11.2* 10.3 14.4*  HGB 9.1* 8.3* 9.9* 9.3*  HCT 27.8* 25.5* 31.3* 28.1*  PLT 298 325 542* 435*    COAGS:  Recent Labs  06/09/15 1030 06/11/15 06/25/15 1826  INR 1.16 1.09 1.06  APTT 44* 40* 42*    BMP:  Recent Labs  06/11/15 0430 06/12/15 0823 06/13/15 0719 06/21/15 0812  NA 136 139 136 134*  K 3.4* 3.2* 3.9 3.5  CL 103 104 102 95*  CO2 26 28 28 29   GLUCOSE 113* 180* 162* 191*  BUN 13 12 10 10   CALCIUM 9.1 9.1  8.9 9.2 10.1  CREATININE <0.30* <0.30* <0.30* <0.30*  GFRNONAA NOT CALCULATED NOT CALCULATED NOT CALCULATED NOT CALCULATED  GFRAA NOT CALCULATED NOT CALCULATED NOT CALCULATED NOT CALCULATED    LIVER FUNCTION TESTS:  Recent Labs  06/07/15 1302 06/11/15 0430 06/12/15 0823  BILITOT 0.3  --  0.2*  AST 99*  --  53*  ALT 79*  --  42  ALKPHOS 235*  --  163*  PROT 6.3*  --   4.8*  ALBUMIN 2.6* 1.5* 1.7*    TUMOR MARKERS: No results for input(s): AFPTM, CEA, CA199, CHROMGRNA in the last 8760 hours.  Assessment and Plan:  GBS Dysphagia Need for long term care Scheduled for percutaneous gastric tube placement 1/6 in IR Risks and Benefits discussed with the patien's son including, but not limited to the need for a barium enema during the procedure, bleeding, infection, peritonitis, or damage to adjacent structures. All of the patient's son questions were answered, he is agreeable to proceed. Consent signed and in chart.   Thank you for this interesting consult.  I greatly enjoyed meeting Curtis Burgess and look forward to participating in their care.  A copy of this report was sent to the requesting provider on this date.  Signed: Edilia Ghuman A 06/26/2015, 3:36 PM   I spent a total of 40 Minutes    in face to face in clinical consultation, greater than 50% of which was counseling/coordinating care for percutaneous gastric tube placement

## 2015-06-27 ENCOUNTER — Other Ambulatory Visit (HOSPITAL_COMMUNITY): Payer: Self-pay

## 2015-06-27 LAB — CBC
HEMATOCRIT: 29 % — AB (ref 39.0–52.0)
HEMOGLOBIN: 9 g/dL — AB (ref 13.0–17.0)
MCH: 26.2 pg (ref 26.0–34.0)
MCHC: 31 g/dL (ref 30.0–36.0)
MCV: 84.5 fL (ref 78.0–100.0)
Platelets: 436 10*3/uL — ABNORMAL HIGH (ref 150–400)
RBC: 3.43 MIL/uL — ABNORMAL LOW (ref 4.22–5.81)
RDW: 15.4 % (ref 11.5–15.5)
WBC: 11.7 10*3/uL — AB (ref 4.0–10.5)

## 2015-06-27 LAB — POTASSIUM: Potassium: 3.5 mmol/L (ref 3.5–5.1)

## 2015-06-27 LAB — MAGNESIUM: MAGNESIUM: 1.7 mg/dL (ref 1.7–2.4)

## 2015-06-29 LAB — CULTURE, RESPIRATORY

## 2015-06-29 LAB — CULTURE, RESPIRATORY W GRAM STAIN

## 2015-06-29 LAB — CREATININE, SERUM

## 2015-06-30 ENCOUNTER — Other Ambulatory Visit (HOSPITAL_COMMUNITY): Payer: Self-pay

## 2015-06-30 LAB — BASIC METABOLIC PANEL
ANION GAP: 7 (ref 5–15)
BUN: 13 mg/dL (ref 6–20)
CALCIUM: 9.9 mg/dL (ref 8.9–10.3)
CHLORIDE: 96 mmol/L — AB (ref 101–111)
CO2: 33 mmol/L — AB (ref 22–32)
Glucose, Bld: 118 mg/dL — ABNORMAL HIGH (ref 65–99)
Potassium: 3.9 mmol/L (ref 3.5–5.1)
SODIUM: 136 mmol/L (ref 135–145)

## 2015-06-30 LAB — MAGNESIUM: Magnesium: 2.1 mg/dL (ref 1.7–2.4)

## 2015-06-30 MED ORDER — MIDAZOLAM HCL 2 MG/2ML IJ SOLN
INTRAMUSCULAR | Status: AC
Start: 2015-06-30 — End: 2015-06-30
  Filled 2015-06-30: qty 2

## 2015-06-30 MED ORDER — LIDOCAINE HCL 1 % IJ SOLN
INTRAMUSCULAR | Status: AC
  Filled 2015-06-30: qty 20

## 2015-06-30 MED ORDER — FENTANYL CITRATE (PF) 100 MCG/2ML IJ SOLN
INTRAMUSCULAR | Status: AC | PRN
  Administered 2015-06-30: 50 ug via INTRAVENOUS

## 2015-06-30 MED ORDER — IOHEXOL 300 MG/ML  SOLN
50.0000 mL | Freq: Once | INTRAMUSCULAR | Status: AC | PRN
  Administered 2015-06-30: 10 mL via INTRAVENOUS

## 2015-06-30 MED ORDER — VANCOMYCIN HCL IN DEXTROSE 1-5 GM/200ML-% IV SOLN
1000.0000 mg | Freq: Once | INTRAVENOUS | Status: AC
  Administered 2015-06-30: 1000 mg via INTRAVENOUS

## 2015-06-30 MED ORDER — MIDAZOLAM HCL 2 MG/2ML IJ SOLN
INTRAMUSCULAR | Status: AC | PRN
  Administered 2015-06-30: 1 mg via INTRAVENOUS

## 2015-06-30 MED ORDER — FENTANYL CITRATE (PF) 100 MCG/2ML IJ SOLN
INTRAMUSCULAR | Status: AC
  Filled 2015-06-30: qty 2

## 2015-06-30 NOTE — Sedation Documentation (Signed)
Respiratory at bedside.

## 2015-06-30 NOTE — Sedation Documentation (Signed)
Patient is resting comfortably. 

## 2015-06-30 NOTE — Sedation Documentation (Signed)
Vital signs stable. 

## 2015-06-30 NOTE — Procedures (Signed)
Interventional Radiology Procedure Note  Procedure: Placement of percutaneous 72F pull-through gastrostomy tube. Complications: None Recommendations: - NPO except for sips and chips remainder of today and overnight  - May advance diet as tolerated and begin using tube tomorrow morning  [name] [name], M.D Pager:  [phone]

## 2015-07-03 ENCOUNTER — Other Ambulatory Visit (HOSPITAL_COMMUNITY): Payer: Self-pay

## 2015-07-03 DIAGNOSIS — J9620 Acute and chronic respiratory failure, unspecified whether with hypoxia or hypercapnia: Secondary | ICD-10-CM

## 2015-07-03 DIAGNOSIS — J9621 Acute and chronic respiratory failure with hypoxia: Secondary | ICD-10-CM

## 2015-07-03 DIAGNOSIS — G610 Guillain-Barre syndrome: Secondary | ICD-10-CM

## 2015-07-03 DIAGNOSIS — J9622 Acute and chronic respiratory failure with hypercapnia: Secondary | ICD-10-CM

## 2015-07-03 DIAGNOSIS — Z9911 Dependence on respirator [ventilator] status: Secondary | ICD-10-CM

## 2015-07-03 DIAGNOSIS — J151 Pneumonia due to Pseudomonas: Secondary | ICD-10-CM

## 2015-07-03 DIAGNOSIS — Z930 Tracheostomy status: Secondary | ICD-10-CM | POA: Diagnosis not present

## 2015-07-03 LAB — CBC
HCT: 27 % — ABNORMAL LOW (ref 39.0–52.0)
Hemoglobin: 8.4 g/dL — ABNORMAL LOW (ref 13.0–17.0)
MCH: 26.3 pg (ref 26.0–34.0)
MCHC: 31.1 g/dL (ref 30.0–36.0)
MCV: 84.6 fL (ref 78.0–100.0)
PLATELETS: 353 10*3/uL (ref 150–400)
RBC: 3.19 MIL/uL — AB (ref 4.22–5.81)
RDW: 15.9 % — ABNORMAL HIGH (ref 11.5–15.5)
WBC: 13.6 10*3/uL — ABNORMAL HIGH (ref 4.0–10.5)

## 2015-07-03 LAB — BASIC METABOLIC PANEL
Anion gap: 5 (ref 5–15)
BUN: 28 mg/dL — AB (ref 6–20)
CO2: 33 mmol/L — ABNORMAL HIGH (ref 22–32)
Calcium: 10.2 mg/dL (ref 8.9–10.3)
Chloride: 99 mmol/L — ABNORMAL LOW (ref 101–111)
Creatinine, Ser: <0.3 mg/dL — ABNORMAL LOW (ref 0.61–1.24)
GLUCOSE: 180 mg/dL — AB (ref 65–99)
Potassium: 3.9 mmol/L (ref 3.5–5.1)
SODIUM: 137 mmol/L (ref 135–145)

## 2015-07-03 LAB — C-REACTIVE PROTEIN: CRP: 13.8 mg/dL — ABNORMAL HIGH (ref <1.0)

## 2015-07-03 NOTE — Consult Note (Signed)
Name: Curtis Burgess MRN: [id number] DOB: 1956/03/28    ADMISSION DATE:  06/11/2015 CONSULTATION DATE:  1/12  REFERRING MD :  [facility]   CHIEF COMPLAINT:  Vent management   BRIEF PATIENT DESCRIPTION:  60yo male with hx HTN, CAD, DM recent prolonged illness r/t guillain Barre with prolonged vent course and trach placement at [employer]. He was tx to [facility], ultimately liberated from vent and was decannulated 12/15. On 12/17 he passed swallow eval and was eating lunch when he suddenly became unresponsive. EMS was called and pt remained unresponsive with GSC 3, tx to [facility] and intubated in ER. He was admitted and, due to ongoing significant weakness r/t GB, he had tracheostomy replaced 12/20.  He was tx to [facility] for further vent weaning.  PCCM consulted 1/12 for assistance with vent weaning.    STUDIES:  CT head 12/17>>> neg acute   CULTURES: Sputum 12/17>>> BCx2 12/17: coag neg SA (from PICC) Sputum 1/5>> pseudomonas >> res imipenem   ANTIBIOTICS: vanc 12/17>>>  Aztreonam 12/17>>>12/19 Levaquin 12/17>>>  SIGNIFICANT EVENTS: 12/19- no pressors 12/20- trach planned, collapse on bronch rt  LINES/TUBES: ETT 12/17>>>12/20 Trach (feinstein #6 cuffed) 12/20>>> RUE PICC (pta) >>>   HISTORY OF PRESENT ILLNESS:  60yo male with hx HTN, CAD, DM recent prolonged illness r/t guillain Barre with prolonged vent course and trach placement at [employer]. He was tx to [facility], ultimately liberated from vent and was decannulated 12/15. On 12/17 he passed swallow eval and was eating lunch when he suddenly became unresponsive. EMS was called and pt remained unresponsive with GSC 3, tx to [facility] and intubated in ER. He was admitted and, due to ongoing significant weakness r/t GB, he had tracheostomy replaced 12/20.  He was tx to [facility] for further vent weaning.  PCCM consulted 1/12 for assistance with vent weaning.    Currently denies SOB on PS 15/5.  C/o ongoing severe weakness r/t GB.  He can move his  head, mouth words and shrug shoulder but little to NO movement of extremities.   PAST MEDICAL HISTORY :   has a past medical history of Guillain Barr syndrome (HCC); Anemia; Hypertension; Coronary artery disease; Diabetes mellitus without complication (HCC); and Dysphagia.  has past surgical history that includes Tracheostomy. Prior to Admission medications   Medication Sig Start Date End Date Taking? Authorizing Provider  acetaminophen (TYLENOL) 325 MG tablet Take 650 mg by mouth every 6 (six) hours as needed (pain).    Historical Provider, MD  acetylcysteine (MUCOMYST) 20 % nebulizer solution Take 4 mLs by nebulization 2 (two) times daily. 06/10/15   Rahul P Desai, PA-C  albuterol (PROVENTIL) (2.5 MG/3ML) 0.083% nebulizer solution Take 3 mLs (2.5 mg total) by nebulization every 2 (two) hours as needed for wheezing. 06/10/15   Rahul P Desai, PA-C  amLODipine (NORVASC) 10 MG tablet Take 10 mg by mouth daily.    Historical Provider, MD  atorvastatin (LIPITOR) 40 MG tablet Take 40 mg by mouth at bedtime.    Historical Provider, MD  carbamazepine (TEGRETOL) 200 MG tablet Take 200 mg by mouth 3 (three) times daily.    Historical Provider, MD  cholecalciferol (VITAMIN D) 1000 UNITS tablet Take 2,000 Units by mouth daily.    Historical Provider, MD  clobetasol ointment (TEMOVATE) 0.05 % Apply 1 application topically 2 (two) times daily.    Historical Provider, MD  dextrose 5 % solution Inject into the vein See admin instructions. Use 1 liter intravenously every shift for hydration at 50 ml/hr  continuous    Historical Provider, MD  escitalopram (LEXAPRO) 20 MG tablet Take 20 mg by mouth at bedtime.    Historical Provider, MD  fentaNYL (SUBLIMAZE) 100 MCG/2ML injection Inject 0.5-1 mLs (25-50 mcg total) into the vein every 2 (two) hours as needed for severe pain. 06/10/15   Rahul P Desai, PA-C  insulin aspart (NOVOLOG) 100 UNIT/ML injection Inject 0-15 Units into the skin every 4 (four) hours. 06/10/15    Rahul P Desai, PA-C  insulin glargine (LANTUS) 100 UNIT/ML injection Inject 0.05 mLs (5 Units total) into the skin at bedtime. 06/10/15   Rahul P Desai, PA-C  lamoTRIgine (LAMICTAL) 150 MG tablet Take 150 mg by mouth daily.    Historical Provider, MD  levofloxacin (LEVAQUIN) 750 MG/150ML SOLN Inject 150 mLs (750 mg total) into the vein daily. 06/10/15   Rahul P Desai, PA-C  metoprolol tartrate (LOPRESSOR) 25 MG tablet Take 12.5 mg by mouth 2 (two) times daily.    Historical Provider, MD  nortriptyline (PAMELOR) 50 MG capsule Take 50 mg by mouth at bedtime.    Historical Provider, MD  Nutritional Supplements (FEEDING SUPPLEMENT, VITAL AF 1.2 CAL,) LIQD Place 1,000 mLs into feeding tube continuous. 06/10/15   Rahul P Desai, PA-C  omeprazole (PRILOSEC) 20 MG capsule Take 20 mg by mouth daily.    Historical Provider, MD  ondansetron (ZOFRAN) 4 MG tablet Take 4 mg by mouth every 6 (six) hours as needed for nausea or vomiting.    Historical Provider, MD  polyethylene glycol (MIRALAX / GLYCOLAX) packet Take 17 g by mouth every 12 (twelve) hours as needed (constipation).    Historical Provider, MD  pregabalin (LYRICA) 25 MG capsule Take 25 mg by mouth 3 (three) times daily.    Historical Provider, MD  primidone (MYSOLINE) 50 MG tablet Take 50 mg by mouth 3 (three) times daily. For bipolar    Historical Provider, MD  sennosides (SENOKOT) 8.8 MG/5ML syrup Place 5 mLs into feeding tube 2 (two) times daily as needed for mild constipation. 06/10/15   Rahul P Desai, PA-C  THROMBI-PAD [facility](THROMBI-PAD) [phone]" PADS Apply 2 each topically once. 06/11/15   Rahul P Desai, PA-C  traMADol (ULTRAM) 50 MG tablet Take 50 mg by mouth every 6 (six) hours as needed (pain).    Historical Provider, MD  traZODone (DESYREL) 50 MG tablet Take 50 mg by mouth at bedtime.    Historical Provider, MD  vancomycin 1,250 mg in sodium chloride 0.9 % 250 mL Inject 1,250 mg into the vein every 8 (eight) hours. 06/10/15   Rahul P Desai, PA-C    vitamin B-12 (CYANOCOBALAMIN) 1000 MCG tablet Take 1,000 mcg by mouth daily.    Historical Provider, MD   Allergies  Allergen Reactions  . Cephalosporins   . Eggs Or Egg-Derived Products Diarrhea    [facility]  . Gluten Meal Diarrhea    [facility]  . Keflex [Cephalexin] Hives    [facility] records  . Milk-Related Compounds Diarrhea    [facility]  . Monosodium Glutamate Diarrhea    [facility]  . Penicillins Other (See Comments)    Listed as an allergy by [facility] and [facility] records but no reaction given  . Latex Rash    Per [facility]    FAMILY HISTORY:  family history is not on file.  - no known family hx autoimmune or lung disease  SOCIAL HISTORY:  REVIEW OF SYSTEMS:   As per HPI - All other systems reviewed and were neg.    SUBJECTIVE:   VITAL SIGNS:   Reviewed at bedside, abnormal values discussed in Imp/plan.   PHYSICAL EXAMINATION: General:  Pleasant male, NAD on vent  Neuro:  Awake, alert, appropriate, severe generalized weakness r/t GB, moves head, mouths words, shrugs shoulders, no movement extremities.   HEENT:  Mm moist, no JVD, trach c/d  Cardiovascular:  s1s2 rrr Lungs:  resps even non labored on PS 15/5, few scattered rhonchi  Abdomen:  Round, soft, non tender  Musculoskeletal:  Warm and dry, generalized edema, LUE>RUE   Recent Labs Lab 06/27/15 0201 06/29/15 1642 06/30/15 0722 07/03/15 0700  NA  --   --  136 137  K 3.5  --  3.9 3.9  CL  --   --  96* 99*  CO2  --   --  33* 33*  BUN  --   --  13 28*  CREATININE  --  <0.30* <0.30* <0.30*  GLUCOSE  --   --  118* 180*    Recent Labs Lab 06/27/15 0725 07/03/15 0700  HGB 9.0* 8.4*  HCT 29.0* 27.0*  WBC 11.7* 13.6*  PLT 436* 353   Dg Chest Port 1 View  07/03/2015  CLINICAL DATA:  Respiratory failure. EXAM: PORTABLE CHEST 1 VIEW COMPARISON:  06/11/2015. FINDINGS: Tracheostomy tube and left IJ line in  stable position. Interim removal of feeding tube. Heart size normal. Low lung volumes with bibasilar atelectasis and/or infiltrates. No pleural effusion or pneumothorax. Smaller free air under the right hemidiaphragm cannot be excluded. Abdominal series can be obtained for further evaluation. Postsurgical changes right shoulder. IMPRESSION: 1. Tracheostomy tube and left IJ line in stable position. Interim removal of feeding tube. 2. Cannot exclude free air in the right hemidiaphragm. Abdominal series can be obtained for further evaluation. 3.  Low lung volumes with bibasilar atelectasis and or infiltrate. Critical Value/emergent results were called by telephone at the time of interpretation on 07/03/2015 at 7:29 am to nurse [facility], who verbally acknowledged these results. Electronically Signed   By: Maisie Fus  Register   On: 07/03/2015 07:32   Dg Abd Portable 1v  07/03/2015  CLINICAL DATA:  Gastrostomy catheter present. Reported elevation of hemidiaphragm. EXAM: PORTABLE ABDOMEN - 1 VIEW COMPARISON:  June 30, 2015 FINDINGS: There remains contrast throughout the colon. There is a gastrostomy catheter in the left upper quadrant in the expected region of the stomach. The bowel gas pattern is unremarkable without obstruction or free air apparent. The hemidiaphragms appear symmetric on this study. IMPRESSION: Contrast throughout colon. No obstruction or free air. Gastrostomy catheter in expected region of stomach. There is no hemidiaphragm elevation on current examination. Electronically Signed   By: Bretta Bang III M.D.   On: 07/03/2015 10:46    ASSESSMENT / PLAN:  Acute on chronic respiratory failure - r/t severe neuromuscular weakness in setting guillain barre with pseudomonas HCAP.  Failed decannulation now S/p re-do trach.  Tolerating some PS wean but significant ongoing weakness raises concern for prolonged vent needs.    PLAN -  [facility]x [facility] - Cefepime, inhaled tobramycin  PS wean per protocol    PRN BD  Ongoing rehab efforts  Intermittent f/u CXR  Consider vent SNF search      Dirk Dress, NP 07/03/2015  12:34 PM Pager: [phone] or [phone]

## 2015-07-06 LAB — CBC
HCT: 26.5 % — ABNORMAL LOW (ref 39.0–52.0)
HEMOGLOBIN: 8.4 g/dL — AB (ref 13.0–17.0)
MCH: 27 pg (ref 26.0–34.0)
MCHC: 31.7 g/dL (ref 30.0–36.0)
MCV: 85.2 fL (ref 78.0–100.0)
PLATELETS: 376 10*3/uL (ref 150–400)
RBC: 3.11 MIL/uL — AB (ref 4.22–5.81)
RDW: 16.1 % — ABNORMAL HIGH (ref 11.5–15.5)
WBC: 10.7 10*3/uL — AB (ref 4.0–10.5)

## 2015-07-06 LAB — PROTIME-INR
INR: 0.96 (ref 0.00–1.49)
PROTHROMBIN TIME: 12.9 s (ref 11.6–15.2)

## 2015-07-07 ENCOUNTER — Other Ambulatory Visit (HOSPITAL_COMMUNITY): Payer: Medicare Other

## 2015-07-07 DIAGNOSIS — Z930 Tracheostomy status: Secondary | ICD-10-CM | POA: Diagnosis not present

## 2015-07-07 DIAGNOSIS — J9621 Acute and chronic respiratory failure with hypoxia: Secondary | ICD-10-CM | POA: Diagnosis not present

## 2015-07-07 DIAGNOSIS — G610 Guillain-Barre syndrome: Secondary | ICD-10-CM | POA: Diagnosis not present

## 2015-07-07 DIAGNOSIS — Z9911 Dependence on respirator [ventilator] status: Secondary | ICD-10-CM | POA: Diagnosis not present

## 2015-07-07 LAB — CBC
HCT: 28.6 % — ABNORMAL LOW (ref 39.0–52.0)
Hemoglobin: 9 g/dL — ABNORMAL LOW (ref 13.0–17.0)
MCH: 26.5 pg (ref 26.0–34.0)
MCHC: 31.5 g/dL (ref 30.0–36.0)
MCV: 84.1 fL (ref 78.0–100.0)
Platelets: 459 10*3/uL — ABNORMAL HIGH (ref 150–400)
RBC: 3.4 MIL/uL — ABNORMAL LOW (ref 4.22–5.81)
RDW: 16.4 % — AB (ref 11.5–15.5)
WBC: 13 10*3/uL — AB (ref 4.0–10.5)

## 2015-07-07 NOTE — Progress Notes (Signed)
Name: Curtis Burgess MRN: [id number] DOB: 1955-07-01    ADMISSION DATE:  06/11/2015 CONSULTATION DATE:  1/12  REFERRING MD :  [facility]   CHIEF COMPLAINT:  Vent management   BRIEF PATIENT DESCRIPTION:  60yo male with hx HTN, CAD, DM recent prolonged illness r/t guillain Barre with prolonged vent course and trach placement at [employer]. He was tx to [facility], ultimately liberated from vent and was decannulated 12/15. On 12/17 he passed swallow eval and was eating lunch when he suddenly became unresponsive. EMS was called and pt remained unresponsive with GSC 3, tx to [facility] and intubated in ER. He was admitted and, due to ongoing significant weakness r/t GB, he had tracheostomy replaced 12/20.  He was tx to [facility] for further vent weaning.  PCCM consulted 1/12 for assistance with vent weaning.    STUDIES:  CT head 12/17>>> neg acute   CULTURES: Sputum 12/17>>> BCx2 12/17: coag neg SA (from PICC) Sputum 1/5>> pseudomonas >> res imipenem   ANTIBIOTICS: vanc 12/17>>>  Aztreonam 12/17>>>12/19 Levaquin 12/17>>>  SIGNIFICANT EVENTS: 12/19- no pressors 12/20- trach planned, collapse on bronch rt  LINES/TUBES: ETT 12/17>>>12/20 Trach (feinstein #6 cuffed) 12/20>>> RUE PICC (pta) >>>    SUBJECTIVE:   VITAL SIGNS: 97.9 hr 96 rr 23 121/59 sats 98%  PHYSICAL EXAMINATION: General:  Pleasant male, NAD on vent  Neuro:  Awake, alert, appropriate, severe generalized weakness r/t GB, moves head, mouths words, shrugs shoulders, no movement extremities.   HEENT:  Mm moist, no JVD, trach c/d  Cardiovascular:  s1s2 rrr Lungs:  resps even non labored on PS 15/5, few scattered rhonchi  Abdomen:  Round, soft, non tender  Musculoskeletal:  Warm and dry, generalized edema, LUE>RUE   Recent Labs Lab 07/03/15 0700  NA 137  K 3.9  CL 99*  CO2 33*  BUN 28*  CREATININE <0.30*  GLUCOSE 180*    Recent Labs Lab 07/03/15 0700 07/06/15 0635 07/07/15 0636  HGB 8.4* 8.4* 9.0*  HCT 27.0*  26.5* 28.6*  WBC 13.6* 10.7* 13.0*  PLT 353 376 459*   Dg Chest Port 1 View  07/07/2015  CLINICAL DATA:  Respiratory failure. EXAM: PORTABLE CHEST 1 VIEW COMPARISON:  07/03/2015 FINDINGS: Support apparatus unchanged. Minimal improvement in the bibasilar airspace opacities. Cardiac and mediastinal margins appear normal. No pleural effusion identified. IMPRESSION: 1. Mild improvement in the bibasilar airspace opacities which could reflect multilobar pneumonia or aspiration pneumonitis. Electronically Signed   By: Gaylyn Rong M.D.   On: 07/07/2015 08:04  improved aeration   ASSESSMENT / PLAN:  Acute on chronic respiratory failure - r/t severe neuromuscular weakness in setting guillain barre with pseudomonas HCAP.  Failed decannulation now S/p re-do trach.  Tolerating some PS wean but significant ongoing weakness raises concern for prolonged vent needs. 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  Consider vent SNF search     Simonne Martinet ACNP-BC [facility] Pulmonary/Critical Care Pager # [phone] OR # [phone] if no answer  Attending Note:  60 year old male with GBS and severe neuromuscular weakness.  Failed decannulation and had to be re-trached.  Coarse BS on exam bilaterally.  CXR I reviewed myself with trach in good position.  Discussed with RT and PCCM-NP.  VDRF due to GBS.  - Continue weaning as able.  -  Goal of 6 hours PS at 14/5.  Hypoxemia:  - Titrate O2 for sat of 8-92%.  - Keep dry.  Tracheostomy status:  - No further decannulation until muscular strength has significantly improved.  Patient seen and examined, agree with above note.  I dictated the care and orders written for this patient under my direction.  Alyson ReedyWesam G Davionte Lusby, MD [phone]

## 2015-07-08 LAB — BASIC METABOLIC PANEL
ANION GAP: 7 (ref 5–15)
BUN: 14 mg/dL (ref 6–20)
CHLORIDE: 94 mmol/L — AB (ref 101–111)
CO2: 34 mmol/L — AB (ref 22–32)
Calcium: 10.3 mg/dL (ref 8.9–10.3)
Creatinine, Ser: <0.3 mg/dL — ABNORMAL LOW (ref 0.61–1.24)
GLUCOSE: 186 mg/dL — AB (ref 65–99)
POTASSIUM: 4 mmol/L (ref 3.5–5.1)
Sodium: 135 mmol/L (ref 135–145)

## 2015-07-09 LAB — CARBAMAZEPINE LEVEL, TOTAL: Carbamazepine Lvl: 4.7 ug/mL (ref 4.0–12.0)

## 2015-07-14 DIAGNOSIS — J9621 Acute and chronic respiratory failure with hypoxia: Secondary | ICD-10-CM | POA: Diagnosis not present

## 2015-07-14 DIAGNOSIS — G610 Guillain-Barre syndrome: Secondary | ICD-10-CM | POA: Diagnosis not present

## 2015-07-14 DIAGNOSIS — Z930 Tracheostomy status: Secondary | ICD-10-CM | POA: Diagnosis not present

## 2015-07-14 DIAGNOSIS — Z9911 Dependence on respirator [ventilator] status: Secondary | ICD-10-CM | POA: Diagnosis not present

## 2015-07-14 NOTE — Progress Notes (Signed)
   Name: Curtis Burgess MRN: [id number] DOB: 09/14/1955    ADMISSION DATE:  06/11/2015 CONSULTATION DATE:  1/12  REF[facility]ING MD :  [facility]   CHIEF COMPLAINT:  Vent management   BRIEF PATIENT DESCRIPTION:  60 y/o male with hx HTN, CAD, DM recent prolonged illness r/t guillain Barre with prolonged vent course and trach placement at [employer]. He was tx to [facility], ultimately liberated from vent and was decannulated 12/15. On 12/17 he passed swallow eval and was eating lunch when he suddenly became unresponsive. EMS was called and pt remained unresponsive with GSC 3, tx to [facility] and intubated in ER. He was admitted and, due to ongoing significant weakness r/t GB, he had tracheostomy replaced 12/20.  He was tx to [facility] for further vent weaning.  PCCM consulted 1/12 for assistance with vent weaning.    STUDIES:  CT head 12/17>>> neg acute   CULTURES: Sputum 12/17 >> pseudomonas >> S - cefepime, ceftaz, gent, zosyn, tobra.  I - cipro.  R - imipenem BCx2 12/17 >> coag neg SA (from PICC) Sputum 1/5 >> pseudomonas >> res imipenem   ANTIBIOTICS: Per primary SVC  SIGNIFICANT EVENTS: 12/19 - no pressors 12/20 - trach planned, collapse on bronch rt 01/23 - weaning on PSV 14/5, 28%  LINES/TUBES: ETT 12/17 >> 12/20 Trach (feinstein #6 cuffed) 12/20 >> RUE PICC (pta) >>    SUBJECTIVE:   VITAL SIGNS: 96.8, 83, 19, 134/83, 97%  PHYSICAL EXAMINATION: General:  Pleasant male, NAD on vent  Neuro:  Awake, alert, appropriate, severe generalized weakness r/t GB, moves head, mouths words, shrugs shoulders, no movement extremities.   HEENT:  MM moist, no JVD, #6 trach c/d/i Cardiovascular:  s1s2 rrr Lungs:  resps even non labored on PS 14/5, coarse bilaterally  Abdomen:  Round, soft, non tender  Musculoskeletal:  Warm and dry, generalized edema  LABS:   Recent Labs Lab 07/08/15 0651  NA 135  K 4.0  CL 94*  CO2 34*  BUN 14  CREATININE <0.30*  GLUCOSE 186*   No results for input(s):  HGB, HCT, WBC, PLT in the last 168 hours. No results found.  ASSESSMENT / PLAN:  Acute on chronic respiratory failure - r/t severe neuromuscular weakness in setting guillain barre with pseudomonas HCAP.  Failed decannulation now s/p re-do trach.  Tolerating some PS wean but significant ongoing weakness raises concern for prolonged vent needs. 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 I have discussed the case with B Ollis, and I agree with the data and plans as amended above.   Levy Pupa, MD, PhD 07/14/2015, 3:03 PM [place] [facility] [phone] or if no answer [phone]

## 2015-07-15 LAB — CBC
HEMATOCRIT: 33.8 % — AB (ref 39.0–52.0)
HEMOGLOBIN: 10.9 g/dL — AB (ref 13.0–17.0)
MCH: 27.4 pg (ref 26.0–34.0)
MCHC: 32.2 g/dL (ref 30.0–36.0)
MCV: 84.9 fL (ref 78.0–100.0)
Platelets: 360 10*3/uL (ref 150–400)
RBC: 3.98 MIL/uL — AB (ref 4.22–5.81)
RDW: 17.3 % — ABNORMAL HIGH (ref 11.5–15.5)
WBC: 13.6 10*3/uL — ABNORMAL HIGH (ref 4.0–10.5)

## 2015-07-15 LAB — BASIC METABOLIC PANEL
Anion gap: 11 (ref 5–15)
BUN: 15 mg/dL (ref 6–20)
CHLORIDE: 93 mmol/L — AB (ref 101–111)
CO2: 31 mmol/L (ref 22–32)
Calcium: 10.5 mg/dL — ABNORMAL HIGH (ref 8.9–10.3)
Glucose, Bld: 189 mg/dL — ABNORMAL HIGH (ref 65–99)
POTASSIUM: 4.5 mmol/L (ref 3.5–5.1)
SODIUM: 135 mmol/L (ref 135–145)

## 2015-07-16 ENCOUNTER — Ambulatory Visit (HOSPITAL_COMMUNITY)
Admission: AD | Admit: 2015-07-16 | Discharge: 2015-07-16 | Disposition: A | Discharge status: Home / Self Care | Payer: Medicare Other | Source: Other Acute Inpatient Hospital | Attending: Internal Medicine | Admitting: Internal Medicine

## 2015-07-16 DIAGNOSIS — G610 Guillain-Barre syndrome: Secondary | ICD-10-CM | POA: Insufficient documentation

## 2015-08-20 DEATH — deceased

## 2016-09-09 IMAGING — CR DG CHEST 1V PORT
1 series · 1 of 1 positions shown · non-contrast
Comparison: PA and lateral chest x-ray June 08, 2015

CLINICAL DATA: Intubated patient, acute respiratory failure,
Guillain-Barre syndrome.

EXAM:
PORTABLE CHEST 1 VIEW

[AP]
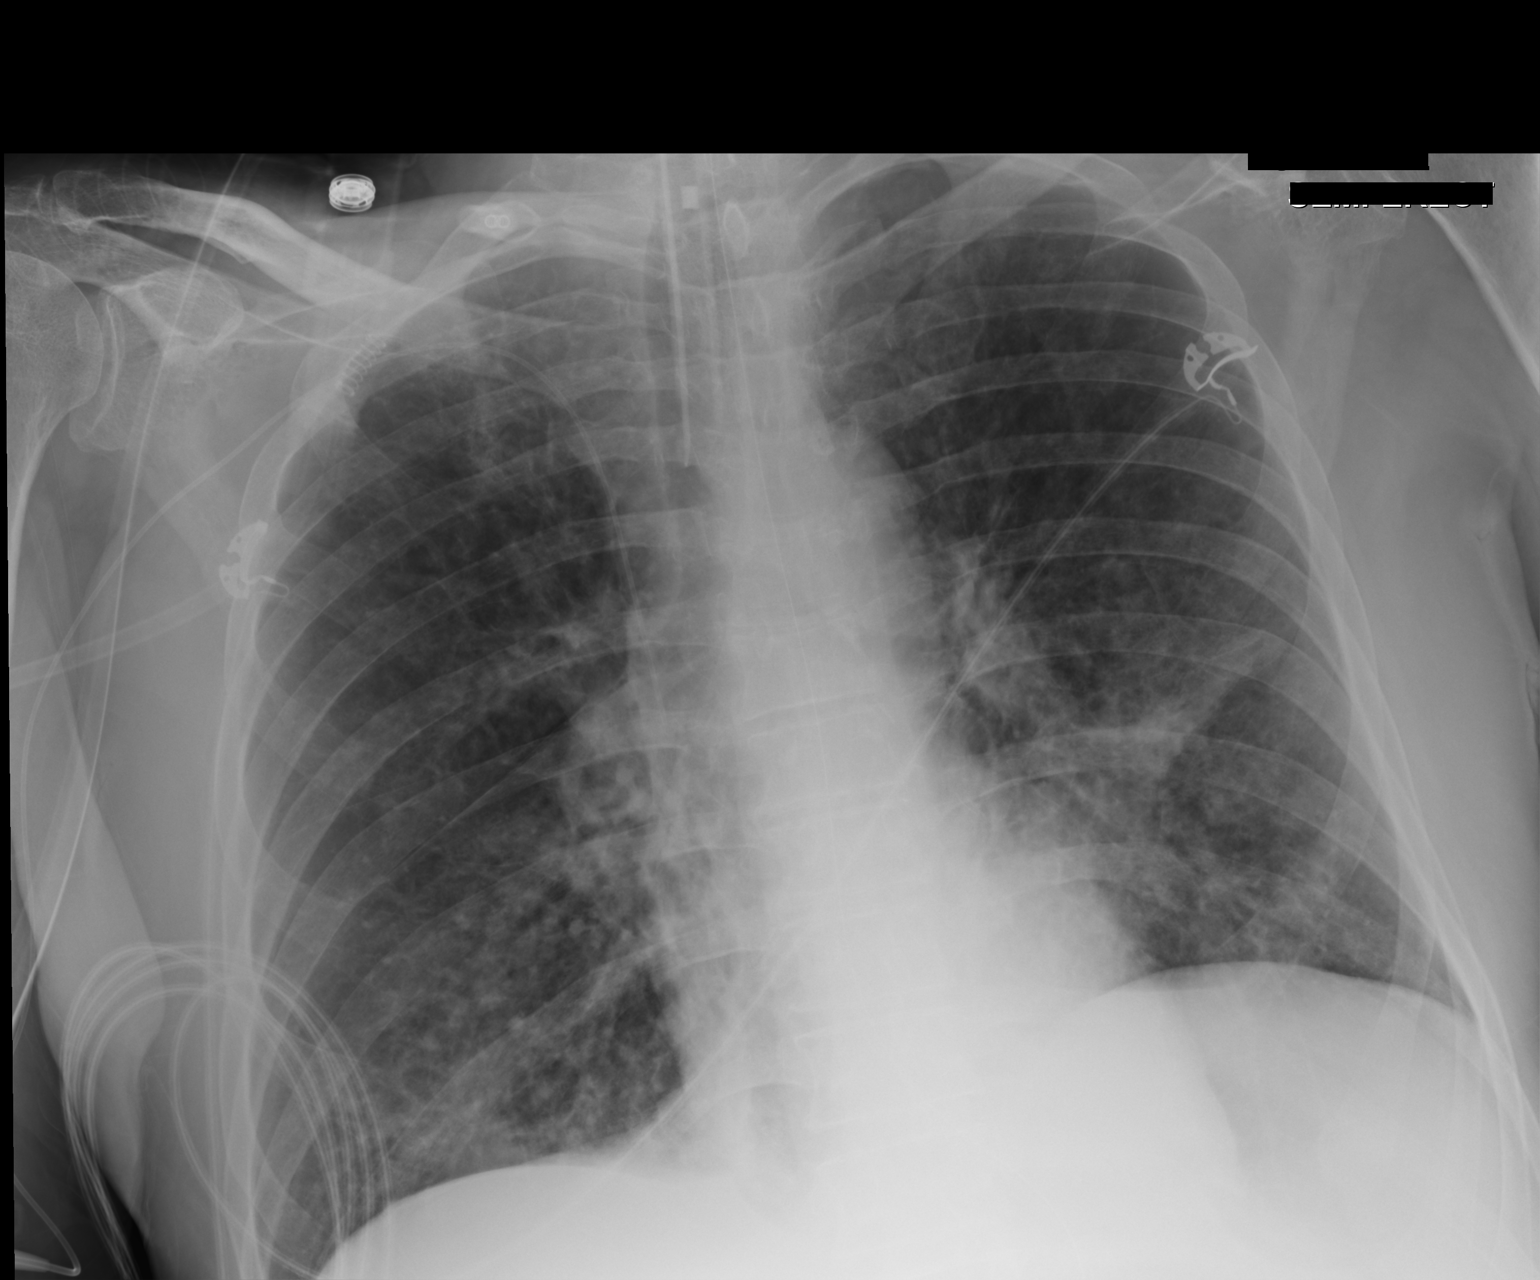

[1 of 1 positions shown; findings below may reference images not displayed]

FINDINGS: The lungs are well-expanded. Confluent alveolar opacity persists
lateral to the left heart border. Increased interstitial infiltrate
elsewhere in the left lung and in the right lung has decreased in
conspicuity. The heart and pulmonary vascularity are normal. There
is no pleural effusion or pneumothorax. The endotracheal tube tip
lies 3.8 cm above the carina. The right-sided PICC line tip projects
over the midportion of the SVC. The esophagogastric tube tip
projects below the inferior margin of the image.
IMPRESSION: Slight interval improvement in bilateral interstitial infiltrates.
Persistent left perihilar subsegmental atelectasis or alveolar
pneumonia.

## 2016-09-09 IMAGING — CR DG ABD PORTABLE 1V
1 series · 1 of 1 positions shown · non-contrast
Comparison: 06/07/15

CLINICAL DATA: Feeding catheter placement

EXAM:
PORTABLE ABDOMEN - 1 VIEW

[AP]
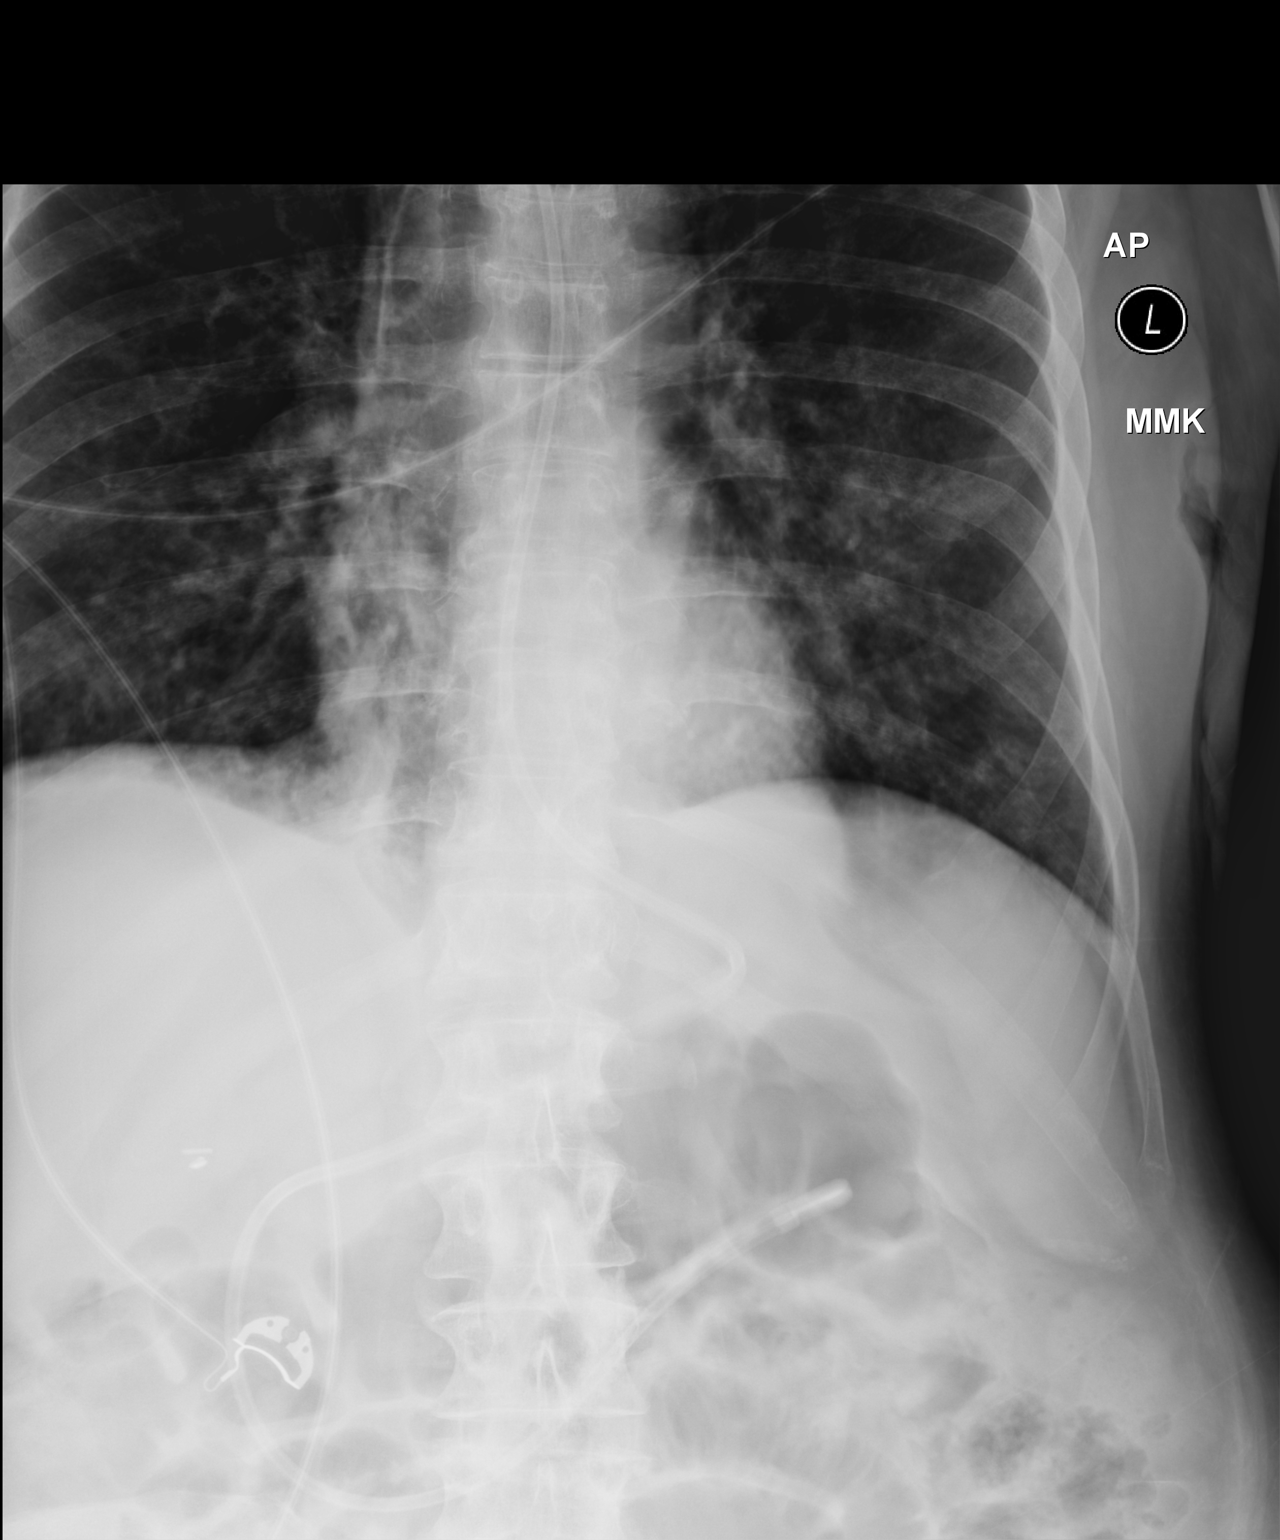

[1 of 1 positions shown; findings below may reference images not displayed]

FINDINGS: Feeding catheter is noted and appears to extend into the proximal
jejunum. Scattered large and small bowel gas is noted. A
tracheostomy and central venous line are noted as well. Postsurgical
changes in the right upper quadrant are noted.
IMPRESSION: Feeding catheter which appears to lie in the proximal jejunum

## 2016-09-10 IMAGING — CR DG ABD PORTABLE 1V
1 series · 1 of 1 positions shown · non-contrast
Comparison: Radiograph dated 06/10/2015

CLINICAL DATA: 59-year-old male with enteric tube placement.
Evaluate for positioning. Radiograph dated 06/10/2015

EXAM:
PORTABLE ABDOMEN - 1 VIEW

[AP]
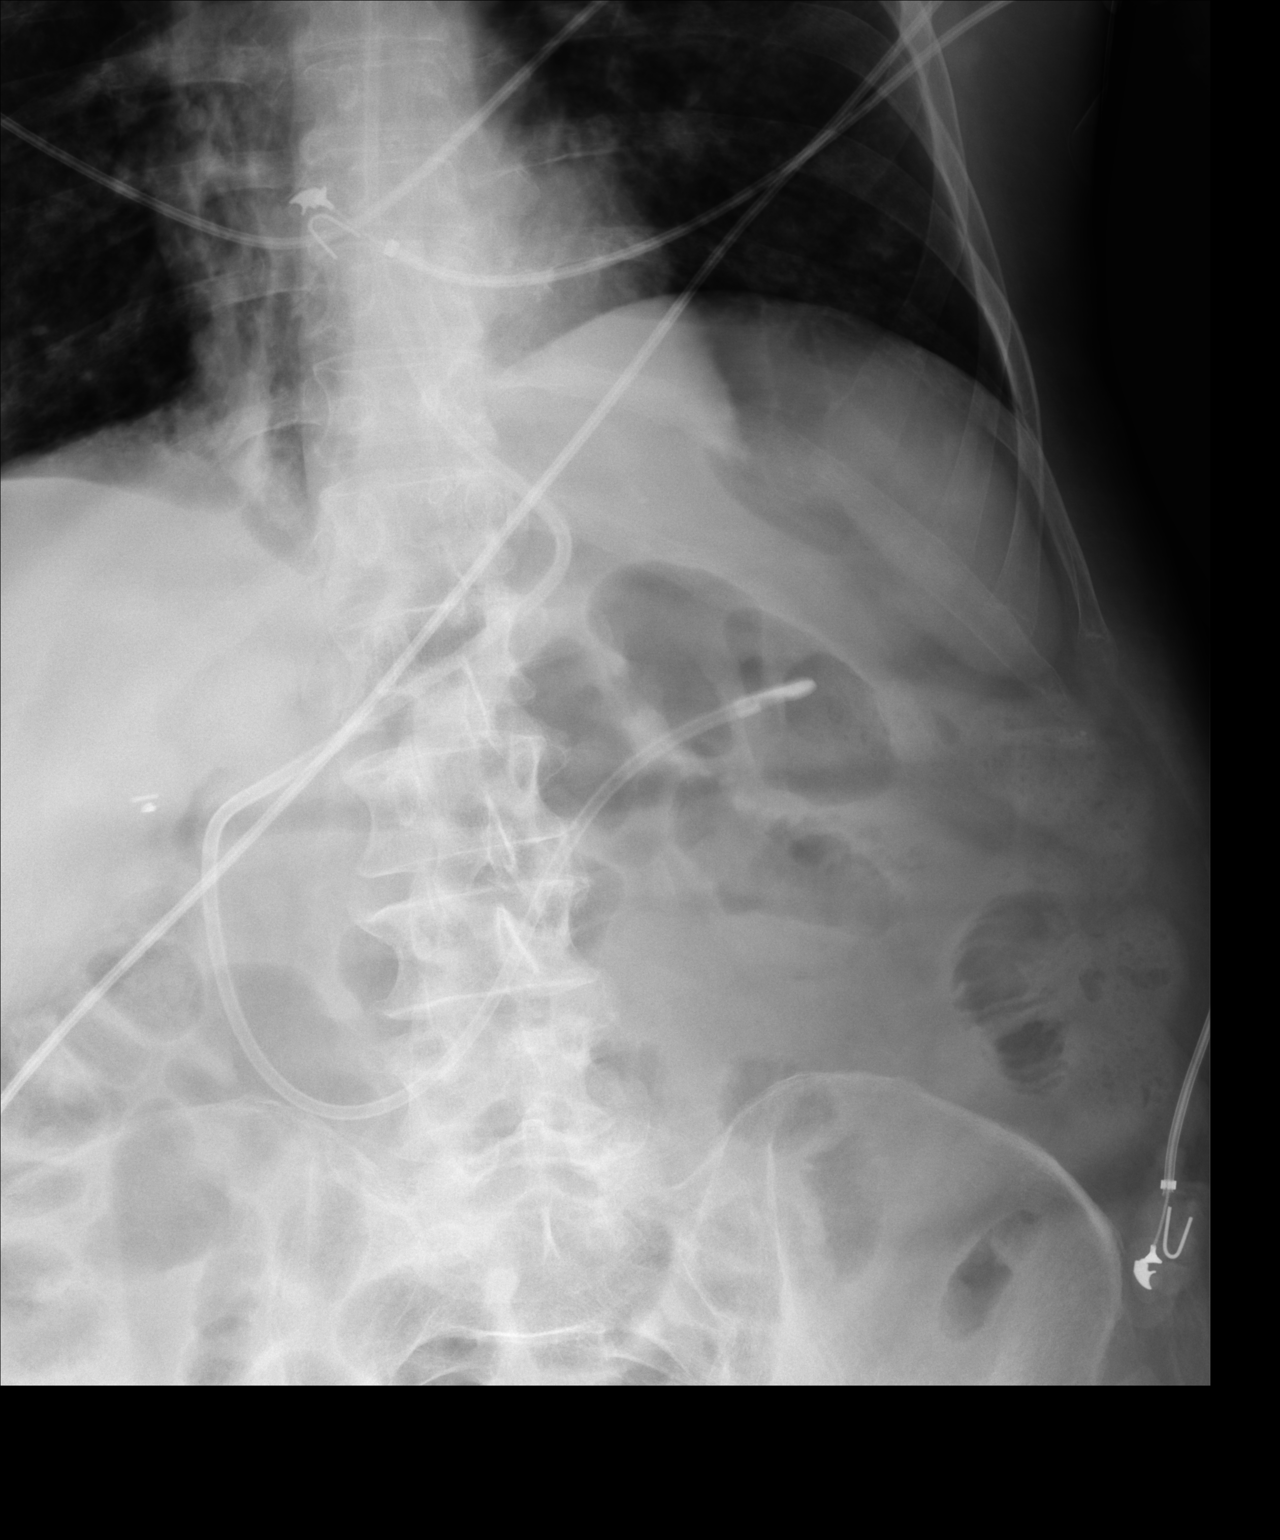

[1 of 1 positions shown; findings below may reference images not displayed]

FINDINGS: An enteric tube is partially visualized over the epigastric area
with tip projecting in the left upper abdomen in similar positioning
as with prior study. :
IMPRESSION: No interval change in the positioning of the enteric tube.

## 2016-10-02 IMAGING — CR DG CHEST 1V PORT
1 series · 1 of 1 positions shown · non-contrast
Comparison: 06/11/2015.

CLINICAL DATA: Respiratory failure.

EXAM:
PORTABLE CHEST 1 VIEW

[AP]
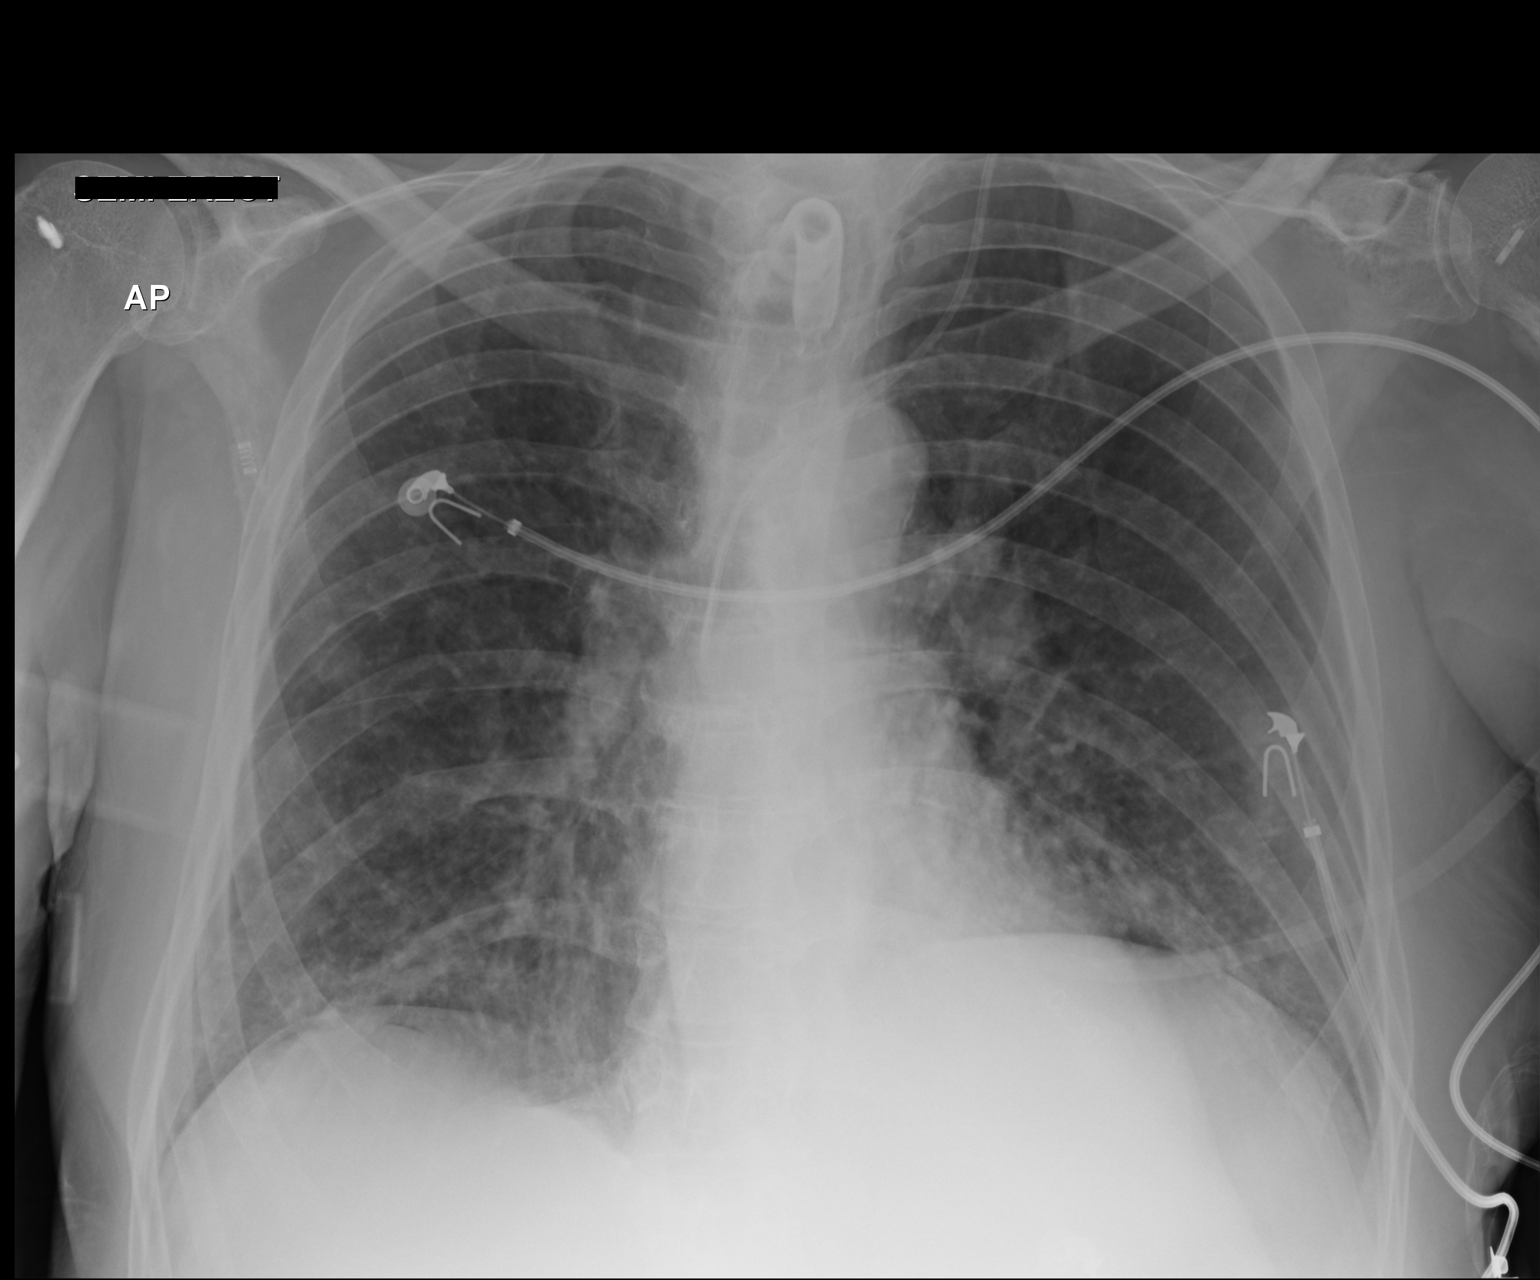

[1 of 1 positions shown; findings below may reference images not displayed]

FINDINGS: Tracheostomy tube and left IJ line in stable position. Interim
removal of feeding tube. Heart size normal. Low lung volumes with
bibasilar atelectasis and/or infiltrates. No pleural effusion or
pneumothorax. Smaller free air under the right hemidiaphragm cannot
be excluded. Abdominal series can be obtained for further
evaluation. Postsurgical changes right shoulder.
IMPRESSION: 1. Tracheostomy tube and left IJ line in stable position. Interim
removal of feeding tube.
2. Cannot exclude free air in the right hemidiaphragm. Abdominal
series can be obtained for further evaluation.
3.  Low lung volumes with bibasilar atelectasis and or infiltrate.
Critical Value/emergent results were called by telephone at the time
of interpretation on 07/03/2015 at [DATE] to nurse Francesca, who
verbally acknowledged these results.
